# Patient Record
Sex: Female | Born: 1960 | Race: White | Hispanic: No | Marital: Married | Smoking: Current every day smoker
Health system: Southern US, Community
[De-identification: ages and names within clinical notes are randomized; demographics above are authoritative.]

## PROBLEM LIST (undated history)

## (undated) DIAGNOSIS — Z923 Personal history of irradiation: Secondary | ICD-10-CM

## (undated) DIAGNOSIS — C50919 Malignant neoplasm of unspecified site of unspecified female breast: Secondary | ICD-10-CM

## (undated) DIAGNOSIS — B019 Varicella without complication: Secondary | ICD-10-CM

## (undated) DIAGNOSIS — C539 Malignant neoplasm of cervix uteri, unspecified: Secondary | ICD-10-CM

## (undated) DIAGNOSIS — Z972 Presence of dental prosthetic device (complete) (partial): Secondary | ICD-10-CM

## (undated) HISTORY — DX: Malignant neoplasm of cervix uteri, unspecified: C53.9

## (undated) HISTORY — PX: COLONOSCOPY: SHX174

## (undated) HISTORY — DX: Personal history of irradiation: Z92.3

## (undated) HISTORY — DX: Varicella without complication: B01.9

---

## 1984-09-28 HISTORY — PX: CERVICAL CONE BIOPSY: SUR198

## 2011-02-08 ENCOUNTER — Emergency Department (HOSPITAL_BASED_OUTPATIENT_CLINIC_OR_DEPARTMENT_OTHER)
Admission: EM | Admit: 2011-02-08 | Discharge: 2011-02-08 | Disposition: A | Discharge status: Home / Self Care | Payer: BC Managed Care – PPO | Attending: Emergency Medicine | Admitting: Emergency Medicine

## 2011-02-08 DIAGNOSIS — F17200 Nicotine dependence, unspecified, uncomplicated: Secondary | ICD-10-CM | POA: Insufficient documentation

## 2011-02-08 DIAGNOSIS — K047 Periapical abscess without sinus: Secondary | ICD-10-CM | POA: Insufficient documentation

## 2012-05-31 ENCOUNTER — Ambulatory Visit: Payer: BC Managed Care – PPO | Admitting: Family Medicine

## 2012-06-27 ENCOUNTER — Telehealth: Payer: Self-pay | Admitting: Family Medicine

## 2012-06-27 ENCOUNTER — Ambulatory Visit (INDEPENDENT_AMBULATORY_CARE_PROVIDER_SITE_OTHER): Payer: BC Managed Care – PPO | Admitting: Family Medicine

## 2012-06-27 ENCOUNTER — Other Ambulatory Visit (HOSPITAL_COMMUNITY)
Admission: RE | Admit: 2012-06-27 | Discharge: 2012-06-27 | Disposition: A | Discharge status: Home / Self Care | Payer: BC Managed Care – PPO | Source: Ambulatory Visit | Attending: Family Medicine | Admitting: Family Medicine

## 2012-06-27 ENCOUNTER — Encounter: Payer: Self-pay | Admitting: Family Medicine

## 2012-06-27 VITALS — BP 127/82 | HR 90 | Temp 97.9°F | Ht 66.0 in | Wt 157.8 lb

## 2012-06-27 DIAGNOSIS — Z01419 Encounter for gynecological examination (general) (routine) without abnormal findings: Secondary | ICD-10-CM

## 2012-06-27 DIAGNOSIS — Z124 Encounter for screening for malignant neoplasm of cervix: Secondary | ICD-10-CM

## 2012-06-27 DIAGNOSIS — F17200 Nicotine dependence, unspecified, uncomplicated: Secondary | ICD-10-CM

## 2012-06-27 DIAGNOSIS — Z0000 Encounter for general adult medical examination without abnormal findings: Secondary | ICD-10-CM | POA: Insufficient documentation

## 2012-06-27 DIAGNOSIS — Z720 Tobacco use: Secondary | ICD-10-CM | POA: Insufficient documentation

## 2012-06-27 LAB — CBC WITH DIFFERENTIAL/PLATELET
Basophils Relative: 0.3 % (ref 0.0–3.0)
Eosinophils Absolute: 0.3 10*3/uL (ref 0.0–0.7)
Eosinophils Relative: 2.9 % (ref 0.0–5.0)
HCT: 45.7 % (ref 36.0–46.0)
Lymphs Abs: 2.1 10*3/uL (ref 0.7–4.0)
MCHC: 33.2 g/dL (ref 30.0–36.0)
MCV: 94.5 fl (ref 78.0–100.0)
Monocytes Absolute: 0.6 10*3/uL (ref 0.1–1.0)
Neutrophils Relative %: 68.4 % (ref 43.0–77.0)
RBC: 4.83 Mil/uL (ref 3.87–5.11)
WBC: 9.2 10*3/uL (ref 4.5–10.5)

## 2012-06-27 LAB — BASIC METABOLIC PANEL
BUN: 13 mg/dL (ref 6–23)
Calcium: 9 mg/dL (ref 8.4–10.5)
Creatinine, Ser: 0.6 mg/dL (ref 0.4–1.2)
GFR: 121.71 mL/min (ref >60.00)
Glucose, Bld: 94 mg/dL (ref 70–99)

## 2012-06-27 LAB — LIPID PANEL
Cholesterol: 186 mg/dL (ref 0–200)
HDL: 51.8 mg/dL (ref >39.00)
Triglycerides: 126 mg/dL (ref 0.0–149.0)
VLDL: 25.2 mg/dL (ref 0.0–40.0)

## 2012-06-27 LAB — HEPATIC FUNCTION PANEL
Albumin: 4.5 g/dL (ref 3.5–5.2)
Total Bilirubin: 0.5 mg/dL (ref 0.3–1.2)

## 2012-06-27 LAB — TSH: TSH: 1.17 u[IU]/mL (ref 0.35–5.50)

## 2012-06-27 NOTE — Patient Instructions (Addendum)
Follow up in 1 year or as needed Complete the stool card and mail it back We'll notify you of your lab results and make any changes if needed Call with any questions or concerns Welcome!  We're glad to have you!!!

## 2012-06-27 NOTE — Progress Notes (Signed)
  Subjective:    Patient ID: Olivia Gross, female    DOB: 30-Dec-1960, 51 y.o.   MRN: [id number]  HPI New to establish.  No local PCP, moved to area 3.5 yrs ago.  No concerns today.  Not interested in colonoscopy.  Overdue on mammo (10 yrs).   Review of Systems Patient reports no vision/ hearing changes, adenopathy,fever, weight change,  persistant/recurrent hoarseness , swallowing issues, chest pain, palpitations, edema, persistant/recurrent cough, hemoptysis, dyspnea (rest/exertional/paroxysmal nocturnal), gastrointestinal bleeding (melena, rectal bleeding), abdominal pain, significant heartburn, bowel changes, GU symptoms (dysuria, hematuria, incontinence), Gyn symptoms (abnormal  bleeding, pain),  syncope, focal weakness, memory loss, numbness & tingling, skin/hair/nail changes, abnormal bruising or bleeding, anxiety, or depression.     Objective:   Physical Exam   General Appearance:    Alert, cooperative, no distress, appears stated age  Head:    Normocephalic, without obvious abnormality, atraumatic  Eyes:    PERRL, conjunctiva/corneas clear, EOM's intact, fundi    benign, both eyes  Ears:    Normal TM's and external ear canals, both ears  Nose:   Nares normal, septum midline, mucosa normal, no drainage    or sinus tenderness  Throat:   Lips, mucosa, and tongue normal; teeth and gums normal  Neck:   Supple, symmetrical, trachea midline, no adenopathy;    Thyroid: no enlargement/tenderness/nodules  Back:     Symmetric, no curvature, ROM normal, no CVA tenderness  Lungs:     Clear to auscultation bilaterally, respirations unlabored  Chest Wall:    No tenderness or deformity   Heart:    Regular rate and rhythm, S1 and S2 normal, no murmur, rub   or gallop  Breast Exam:    No tenderness, masses, or nipple abnormality  Abdomen:     Soft, non-tender, bowel sounds active all four quadrants,    no masses, no organomegaly  Genitalia:    External genitalia normal, cervix normal in appearance,  no CMT, uterus in normal size and position, adnexa w/out mass or tenderness, mucosa pink and moist, no lesions or discharge present  Rectal:    Normal external appearance  Extremities:   Extremities normal, atraumatic, no cyanosis or edema  Pulses:   2+ and symmetric all extremities  Skin:   Skin color, texture, turgor normal, no rashes or lesions  Lymph nodes:   Cervical, supraclavicular, and axillary nodes normal  Neurologic:   CNII-XII intact, normal strength, sensation and reflexes    throughout          Assessment & Plan:

## 2012-06-27 NOTE — Assessment & Plan Note (Signed)
Pt's PE WNL.  Refer for mammo.  Refusing colonoscopy- willing to complete iFOB.  Pap collected.  Check labs.  Anticipatory guidance provided.

## 2012-06-27 NOTE — Telephone Encounter (Signed)
LM ON TRIAGE LINE 233PM -  what does she need to do with IFOP & Mammogram referral  CB# [phone]

## 2012-06-27 NOTE — Assessment & Plan Note (Signed)
New to provider, chronic for pt.  Encouraged smoking cessation.  Pt not interested.

## 2012-06-27 NOTE — Assessment & Plan Note (Signed)
Pap collected. 

## 2012-06-27 NOTE — Telephone Encounter (Signed)
Advised pt to follow directions given with IFOB today and either mail it in or drop it off at our office, also that someone from this office will contact her about the mammo apt, pt understood all instructions

## 2012-06-28 ENCOUNTER — Encounter: Payer: Self-pay | Admitting: *Deleted

## 2012-06-28 ENCOUNTER — Other Ambulatory Visit: Payer: BC Managed Care – PPO

## 2012-06-28 DIAGNOSIS — Z01419 Encounter for gynecological examination (general) (routine) without abnormal findings: Secondary | ICD-10-CM

## 2012-06-28 LAB — FECAL OCCULT BLOOD, IMMUNOCHEMICAL: Fecal Occult Bld: NEGATIVE

## 2012-06-29 ENCOUNTER — Encounter: Payer: Self-pay | Admitting: *Deleted

## 2012-06-30 ENCOUNTER — Encounter: Payer: Self-pay | Admitting: *Deleted

## 2012-07-01 LAB — VITAMIN D 1,25 DIHYDROXY: Vitamin D 1, 25 (OH)2 Total: 58 pg/mL (ref 18–72)

## 2012-07-06 ENCOUNTER — Ambulatory Visit (HOSPITAL_BASED_OUTPATIENT_CLINIC_OR_DEPARTMENT_OTHER)
Admission: RE | Admit: 2012-07-06 | Discharge: 2012-07-06 | Disposition: A | Discharge status: Home / Self Care | Payer: BC Managed Care – PPO | Source: Ambulatory Visit | Attending: Family Medicine | Admitting: Family Medicine

## 2012-07-06 ENCOUNTER — Ambulatory Visit: Payer: BC Managed Care – PPO

## 2012-07-06 DIAGNOSIS — Z1231 Encounter for screening mammogram for malignant neoplasm of breast: Secondary | ICD-10-CM | POA: Insufficient documentation

## 2012-07-06 DIAGNOSIS — R922 Inconclusive mammogram: Secondary | ICD-10-CM | POA: Insufficient documentation

## 2012-07-06 DIAGNOSIS — Z01419 Encounter for gynecological examination (general) (routine) without abnormal findings: Secondary | ICD-10-CM

## 2012-07-08 ENCOUNTER — Other Ambulatory Visit: Payer: Self-pay | Admitting: Family Medicine

## 2012-07-08 DIAGNOSIS — R928 Other abnormal and inconclusive findings on diagnostic imaging of breast: Secondary | ICD-10-CM

## 2012-07-14 ENCOUNTER — Ambulatory Visit
Admission: RE | Admit: 2012-07-14 | Discharge: 2012-07-14 | Disposition: A | Discharge status: Home / Self Care | Payer: BC Managed Care – PPO | Source: Ambulatory Visit | Attending: Family Medicine | Admitting: Family Medicine

## 2012-07-14 ENCOUNTER — Other Ambulatory Visit: Payer: Self-pay | Admitting: Family Medicine

## 2012-07-14 DIAGNOSIS — R928 Other abnormal and inconclusive findings on diagnostic imaging of breast: Secondary | ICD-10-CM

## 2012-07-14 DIAGNOSIS — N6310 Unspecified lump in the right breast, unspecified quadrant: Secondary | ICD-10-CM

## 2012-07-27 ENCOUNTER — Ambulatory Visit
Admission: RE | Admit: 2012-07-27 | Discharge: 2012-07-27 | Disposition: A | Discharge status: Home / Self Care | Payer: BC Managed Care – PPO | Source: Ambulatory Visit | Attending: Family Medicine | Admitting: Family Medicine

## 2012-07-27 DIAGNOSIS — N6310 Unspecified lump in the right breast, unspecified quadrant: Secondary | ICD-10-CM

## 2013-08-03 ENCOUNTER — Telehealth: Payer: Self-pay

## 2013-08-03 NOTE — Telephone Encounter (Addendum)
Left message for call back  identifiable Medication and allergies: reviewed and updated  90 day supply/mail order: na Local pharmacy: [facility]   Immunizations due:  Declines flu vaccine  A/P:   No changes to FH or PSH CCS--due--FOB-neg 06/2012 Pap-05/2012 MMG--06/2012--abnormal--US core biopsy--reccommendations q year  To Discuss with Provider: Requesting pap only No CPX on file

## 2013-08-04 ENCOUNTER — Ambulatory Visit (INDEPENDENT_AMBULATORY_CARE_PROVIDER_SITE_OTHER): Payer: BC Managed Care – PPO | Admitting: Family Medicine

## 2013-08-04 ENCOUNTER — Encounter: Payer: Self-pay | Admitting: Family Medicine

## 2013-08-04 VITALS — BP 130/88 | HR 80 | Temp 98.3°F | Resp 17 | Ht 66.5 in | Wt 162.2 lb

## 2013-08-04 DIAGNOSIS — Z23 Encounter for immunization: Secondary | ICD-10-CM

## 2013-08-04 DIAGNOSIS — Z0000 Encounter for general adult medical examination without abnormal findings: Secondary | ICD-10-CM

## 2013-08-04 DIAGNOSIS — Z1331 Encounter for screening for depression: Secondary | ICD-10-CM

## 2013-08-04 LAB — CBC WITH DIFFERENTIAL/PLATELET
Basophils Absolute: 0 10*3/uL (ref 0.0–0.1)
Basophils Relative: 0 % (ref 0–1)
Eosinophils Absolute: 0.2 10*3/uL (ref 0.0–0.7)
MCHC: 34.9 g/dL (ref 30.0–36.0)
Neutro Abs: 3.6 10*3/uL (ref 1.7–7.7)
Neutrophils Relative %: 51 % (ref 43–77)
Platelets: 231 10*3/uL (ref 150–400)
RDW: 13.3 % (ref 11.5–15.5)

## 2013-08-04 LAB — HEPATIC FUNCTION PANEL
Albumin: 4.4 g/dL (ref 3.5–5.2)
Bilirubin, Direct: 0.2 mg/dL (ref 0.0–0.3)
Total Bilirubin: 0.7 mg/dL (ref 0.3–1.2)

## 2013-08-04 LAB — BASIC METABOLIC PANEL
BUN: 11 mg/dL (ref 6–23)
CO2: 28 mEq/L (ref 19–32)
Calcium: 9.3 mg/dL (ref 8.4–10.5)
Creat: 0.71 mg/dL (ref 0.50–1.10)
Glucose, Bld: 76 mg/dL (ref 70–99)

## 2013-08-04 LAB — LIPID PANEL
Cholesterol: 167 mg/dL (ref 0–200)
HDL: 49 mg/dL (ref >39)
Total CHOL/HDL Ratio: 3.4 Ratio

## 2013-08-04 LAB — TSH: TSH: 1.087 u[IU]/mL (ref 0.350–4.500)

## 2013-08-04 NOTE — Addendum Note (Signed)
Addended by: Jackson Latino on: 08/04/2013 05:02 PM   Modules accepted: Orders

## 2013-08-04 NOTE — Addendum Note (Signed)
Addended by: Silvio Pate D on: 08/04/2013 04:28 PM   Modules accepted: Orders

## 2013-08-04 NOTE — Assessment & Plan Note (Signed)
Pt's PE WNL.  UTD on pap, plans to schedule mammo, due for colonoscopy- referral made.  Check labs.  Anticipatory guidance provided.

## 2013-08-04 NOTE — Patient Instructions (Signed)
Schedule your complete physical in 1 year We'll notify you of your lab results and make any changes if needed Schedule your mammogram Try and quit smoking! Call with any questions or concerns Happy Holidays!!

## 2013-08-04 NOTE — Progress Notes (Signed)
  Subjective:    Patient ID: Olivia Gross, female    DOB: November 03, 1960, 52 y.o.   MRN: [id number]  HPI CPE- UTD on pap.  Due for mammo and colonoscopy.   Review of Systems Patient reports no vision/ hearing changes, adenopathy,fever, weight change,  persistant/recurrent hoarseness , swallowing issues, chest pain, palpitations, edema, persistant/recurrent cough, hemoptysis, dyspnea (rest/exertional/paroxysmal nocturnal), gastrointestinal bleeding (melena, rectal bleeding), abdominal pain, significant heartburn, bowel changes, GU symptoms (dysuria, hematuria, incontinence), Gyn symptoms (abnormal  bleeding, pain),  syncope, focal weakness, memory loss, numbness & tingling, skin/hair/nail changes, abnormal bruising or bleeding, anxiety, or depression.     Objective:   Physical Exam  General Appearance:    Alert, cooperative, no distress, appears stated age  Head:    Normocephalic, without obvious abnormality, atraumatic  Eyes:    PERRL, conjunctiva/corneas clear, EOM's intact, fundi    benign, both eyes  Ears:    Normal TM's and external ear canals, both ears  Nose:   Nares normal, septum midline, mucosa normal, no drainage    or sinus tenderness  Throat:   Lips, mucosa, and tongue normal; teeth and gums normal  Neck:   Supple, symmetrical, trachea midline, no adenopathy;    Thyroid: no enlargement/tenderness/nodules  Back:     Symmetric, no curvature, ROM normal, no CVA tenderness  Lungs:     Clear to auscultation bilaterally, respirations unlabored  Chest Wall:    No tenderness or deformity   Heart:    Regular rate and rhythm, S1 and S2 normal, no murmur, rub   or gallop  Breast Exam:    No tenderness, masses, or nipple abnormality  Abdomen:     Soft, non-tender, bowel sounds active all four quadrants,    no masses, no organomegaly  Genitalia:    deferred  Rectal:    Extremities:   Extremities normal, atraumatic, no cyanosis or edema  Pulses:   2+ and symmetric all extremities  Skin:    Skin color, texture, turgor normal, no rashes or lesions  Lymph nodes:   Cervical, supraclavicular, and axillary nodes normal  Neurologic:   CNII-XII intact, normal strength, sensation and reflexes    throughout          Assessment & Plan:

## 2013-08-07 ENCOUNTER — Other Ambulatory Visit: Payer: Self-pay

## 2013-08-07 ENCOUNTER — Encounter: Payer: Self-pay | Admitting: General Practice

## 2013-08-07 DIAGNOSIS — Z1231 Encounter for screening mammogram for malignant neoplasm of breast: Secondary | ICD-10-CM

## 2013-08-11 LAB — VITAMIN D 1,25 DIHYDROXY: Vitamin D2 1, 25 (OH)2: <8 pg/mL

## 2013-08-16 ENCOUNTER — Telehealth: Payer: Self-pay | Admitting: Family Medicine

## 2013-08-16 DIAGNOSIS — Z1211 Encounter for screening for malignant neoplasm of colon: Secondary | ICD-10-CM

## 2013-08-16 NOTE — Telephone Encounter (Signed)
Referral placed.

## 2013-08-16 NOTE — Telephone Encounter (Signed)
Please refer to GI for colon cancer screen- thanks!

## 2013-08-16 NOTE — Telephone Encounter (Signed)
Patient am in on 08/04/13 and states that she was told she would be called to schedule a colonoscopy. She has not heard back from anyone. I did not see a referral for this in her chart. Please advise.

## 2013-08-23 ENCOUNTER — Encounter: Payer: BC Managed Care – PPO | Admitting: Family Medicine

## 2013-09-06 ENCOUNTER — Ambulatory Visit
Admission: RE | Admit: 2013-09-06 | Discharge: 2013-09-06 | Disposition: A | Discharge status: Home / Self Care | Payer: BC Managed Care – PPO | Source: Ambulatory Visit

## 2013-09-06 DIAGNOSIS — Z1231 Encounter for screening mammogram for malignant neoplasm of breast: Secondary | ICD-10-CM

## 2013-09-14 ENCOUNTER — Other Ambulatory Visit: Payer: Self-pay | Admitting: Family Medicine

## 2013-09-14 DIAGNOSIS — R928 Other abnormal and inconclusive findings on diagnostic imaging of breast: Secondary | ICD-10-CM

## 2013-10-12 ENCOUNTER — Ambulatory Visit
Admission: RE | Admit: 2013-10-12 | Discharge: 2013-10-12 | Disposition: A | Discharge status: Home / Self Care | Payer: BC Managed Care – PPO | Source: Ambulatory Visit | Attending: Family Medicine | Admitting: Family Medicine

## 2013-10-12 ENCOUNTER — Other Ambulatory Visit: Payer: Self-pay | Admitting: Family Medicine

## 2013-10-12 DIAGNOSIS — R928 Other abnormal and inconclusive findings on diagnostic imaging of breast: Secondary | ICD-10-CM

## 2013-10-25 ENCOUNTER — Ambulatory Visit
Admission: RE | Admit: 2013-10-25 | Discharge: 2013-10-25 | Disposition: A | Discharge status: Home / Self Care | Payer: BC Managed Care – PPO | Source: Ambulatory Visit | Attending: Family Medicine | Admitting: Family Medicine

## 2013-10-25 ENCOUNTER — Other Ambulatory Visit: Payer: Self-pay | Admitting: Family Medicine

## 2013-10-25 DIAGNOSIS — C50919 Malignant neoplasm of unspecified site of unspecified female breast: Secondary | ICD-10-CM

## 2013-10-25 DIAGNOSIS — R928 Other abnormal and inconclusive findings on diagnostic imaging of breast: Secondary | ICD-10-CM

## 2013-10-25 HISTORY — DX: Malignant neoplasm of unspecified site of unspecified female breast: C50.919

## 2013-10-26 ENCOUNTER — Other Ambulatory Visit: Payer: Self-pay | Admitting: Family Medicine

## 2013-10-26 DIAGNOSIS — C50919 Malignant neoplasm of unspecified site of unspecified female breast: Secondary | ICD-10-CM

## 2013-10-27 ENCOUNTER — Telehealth: Payer: Self-pay | Admitting: *Deleted

## 2013-10-27 DIAGNOSIS — C50212 Malignant neoplasm of upper-inner quadrant of left female breast: Secondary | ICD-10-CM | POA: Insufficient documentation

## 2013-10-27 NOTE — Telephone Encounter (Signed)
Confirmed [facility] for 11/01/13 at 12N .  Instructions and contact information given.

## 2013-10-30 ENCOUNTER — Ambulatory Visit
Admission: RE | Admit: 2013-10-30 | Discharge: 2013-10-30 | Disposition: A | Discharge status: Home / Self Care | Payer: BC Managed Care – PPO | Source: Ambulatory Visit | Attending: Family Medicine | Admitting: Family Medicine

## 2013-10-30 DIAGNOSIS — C50919 Malignant neoplasm of unspecified site of unspecified female breast: Secondary | ICD-10-CM

## 2013-10-30 MED ORDER — GADOBENATE DIMEGLUMINE 529 MG/ML IV SOLN
15.0000 mL | Freq: Once | INTRAVENOUS | Status: AC | PRN
  Administered 2013-10-30: 15 mL via INTRAVENOUS

## 2013-11-01 ENCOUNTER — Telehealth: Payer: Self-pay | Admitting: *Deleted

## 2013-11-01 ENCOUNTER — Other Ambulatory Visit (HOSPITAL_BASED_OUTPATIENT_CLINIC_OR_DEPARTMENT_OTHER): Payer: BC Managed Care – PPO

## 2013-11-01 ENCOUNTER — Ambulatory Visit: Payer: BC Managed Care – PPO | Attending: General Surgery | Admitting: Physical Therapy

## 2013-11-01 ENCOUNTER — Other Ambulatory Visit: Payer: Self-pay | Admitting: Oncology

## 2013-11-01 ENCOUNTER — Encounter: Payer: Self-pay | Admitting: Oncology

## 2013-11-01 ENCOUNTER — Ambulatory Visit
Admission: RE | Admit: 2013-11-01 | Discharge: 2013-11-01 | Disposition: A | Discharge status: Home / Self Care | Payer: BC Managed Care – PPO | Source: Ambulatory Visit | Attending: Radiation Oncology | Admitting: Radiation Oncology

## 2013-11-01 ENCOUNTER — Encounter (INDEPENDENT_AMBULATORY_CARE_PROVIDER_SITE_OTHER): Payer: Self-pay | Admitting: General Surgery

## 2013-11-01 ENCOUNTER — Ambulatory Visit (HOSPITAL_BASED_OUTPATIENT_CLINIC_OR_DEPARTMENT_OTHER): Payer: BC Managed Care – PPO | Admitting: General Surgery

## 2013-11-01 ENCOUNTER — Ambulatory Visit: Payer: BC Managed Care – PPO

## 2013-11-01 ENCOUNTER — Ambulatory Visit (HOSPITAL_BASED_OUTPATIENT_CLINIC_OR_DEPARTMENT_OTHER): Payer: BC Managed Care – PPO | Admitting: Oncology

## 2013-11-01 VITALS — BP 151/91 | HR 88 | Temp 97.7°F | Resp 18 | Ht 66.0 in | Wt 160.3 lb

## 2013-11-01 DIAGNOSIS — Z170 Estrogen receptor positive status [ER+]: Secondary | ICD-10-CM

## 2013-11-01 DIAGNOSIS — C50212 Malignant neoplasm of upper-inner quadrant of left female breast: Secondary | ICD-10-CM

## 2013-11-01 DIAGNOSIS — F17200 Nicotine dependence, unspecified, uncomplicated: Secondary | ICD-10-CM | POA: Insufficient documentation

## 2013-11-01 DIAGNOSIS — Z720 Tobacco use: Secondary | ICD-10-CM

## 2013-11-01 DIAGNOSIS — C50919 Malignant neoplasm of unspecified site of unspecified female breast: Secondary | ICD-10-CM | POA: Insufficient documentation

## 2013-11-01 DIAGNOSIS — C50219 Malignant neoplasm of upper-inner quadrant of unspecified female breast: Secondary | ICD-10-CM

## 2013-11-01 DIAGNOSIS — IMO0001 Reserved for inherently not codable concepts without codable children: Secondary | ICD-10-CM | POA: Insufficient documentation

## 2013-11-01 DIAGNOSIS — R293 Abnormal posture: Secondary | ICD-10-CM | POA: Insufficient documentation

## 2013-11-01 LAB — CBC WITH DIFFERENTIAL/PLATELET
BASO%: 0.6 % (ref 0.0–2.0)
Basophils Absolute: 0 10*3/uL (ref 0.0–0.1)
EOS ABS: 0.1 10*3/uL (ref 0.0–0.5)
EOS%: 1.5 % (ref 0.0–7.0)
HCT: 41.5 % (ref 34.8–46.6)
HGB: 13.9 g/dL (ref 11.6–15.9)
LYMPH%: 33.6 % (ref 14.0–49.7)
MCH: 31.2 pg (ref 25.1–34.0)
MCHC: 33.7 g/dL (ref 31.5–36.0)
MCV: 92.9 fL (ref 79.5–101.0)
MONO#: 0.4 10*3/uL (ref 0.1–0.9)
MONO%: 5.4 % (ref 0.0–14.0)
NEUT%: 58.9 % (ref 38.4–76.8)
NEUTROS ABS: 4.4 10*3/uL (ref 1.5–6.5)
Platelets: 240 10*3/uL (ref 145–400)
RBC: 4.46 10*6/uL (ref 3.70–5.45)
RDW: 13.1 % (ref 11.2–14.5)
WBC: 7.4 10*3/uL (ref 3.9–10.3)
lymph#: 2.5 10*3/uL (ref 0.9–3.3)

## 2013-11-01 LAB — COMPREHENSIVE METABOLIC PANEL (CC13)
ALBUMIN: 4.7 g/dL (ref 3.5–5.0)
ALK PHOS: 50 U/L (ref 40–150)
ALT: 15 U/L (ref 0–55)
AST: 14 U/L (ref 5–34)
Anion Gap: 10 mEq/L (ref 3–11)
BILIRUBIN TOTAL: 0.84 mg/dL (ref 0.20–1.20)
BUN: 12 mg/dL (ref 7.0–26.0)
CO2: 26 mEq/L (ref 22–29)
Calcium: 9.9 mg/dL (ref 8.4–10.4)
Chloride: 105 mEq/L (ref 98–109)
Creatinine: 0.7 mg/dL (ref 0.6–1.1)
Glucose: 91 mg/dl (ref 70–140)
POTASSIUM: 3.8 meq/L (ref 3.5–5.1)
Sodium: 141 mEq/L (ref 136–145)
TOTAL PROTEIN: 7.2 g/dL (ref 6.4–8.3)

## 2013-11-01 NOTE — Patient Instructions (Signed)
Smoking Cessation Quitting smoking is important to your health and has many advantages. However, it is not always easy to quit since nicotine is a very addictive drug. Often times, people try 3 times or more before being able to quit. This document explains the best ways for you to prepare to quit smoking. Quitting takes hard work and a lot of effort, but you can do it. ADVANTAGES OF QUITTING SMOKING  You will live longer, feel better, and live better.  Your body will feel the impact of quitting smoking almost immediately.  Within 20 minutes, blood pressure decreases. Your pulse returns to its normal level.  After 8 hours, carbon monoxide levels in the blood return to normal. Your oxygen level increases.  After 24 hours, the chance of having a heart attack starts to decrease. Your breath, hair, and body stop smelling like smoke.  After 48 hours, damaged nerve endings begin to recover. Your sense of taste and smell improve.  After 72 hours, the body is virtually free of nicotine. Your bronchial tubes relax and breathing becomes easier.  After 2 to 12 weeks, lungs can hold more air. Exercise becomes easier and circulation improves.  The risk of having a heart attack, stroke, cancer, or lung disease is greatly reduced.  After 1 year, the risk of coronary heart disease is cut in half.  After 5 years, the risk of stroke falls to the same as a nonsmoker.  After 10 years, the risk of lung cancer is cut in half and the risk of other cancers decreases significantly.  After 15 years, the risk of coronary heart disease drops, usually to the level of a nonsmoker.  If you are pregnant, quitting smoking will improve your chances of having a healthy baby.  The people you live with, especially any children, will be healthier.  You will have extra money to spend on things other than cigarettes. QUESTIONS TO THINK ABOUT BEFORE ATTEMPTING TO QUIT You may want to talk about your answers with your  caregiver.  Why do you want to quit?  If you tried to quit in the past, what helped and what did not?  What will be the most difficult situations for you after you quit? How will you plan to handle them?  Who can help you through the tough times? Your family? Friends? A caregiver?  What pleasures do you get from smoking? What ways can you still get pleasure if you quit? Here are some questions to ask your caregiver:  How can you help me to be successful at quitting?  What medicine do you think would be best for me and how should I take it?  What should I do if I need more help?  What is smoking withdrawal like? How can I get information on withdrawal? GET READY  Set a quit date.  Change your environment by getting rid of all cigarettes, ashtrays, matches, and lighters in your home, car, or work. Do not let people smoke in your home.  Review your past attempts to quit. Think about what worked and what did not. GET SUPPORT AND ENCOURAGEMENT You have a better chance of being successful if you have help. You can get support in many ways.  Tell your family, friends, and co-workers that you are going to quit and need their support. Ask them not to smoke around you.  Get individual, group, or telephone counseling and support. Programs are available at local hospitals and health centers. Call your local health department for   information about programs in your area.  Spiritual beliefs and practices may help some smokers quit.  Download a "quit meter" on your computer to keep track of quit statistics, such as how long you have gone without smoking, cigarettes not smoked, and money saved.  Get a self-help book about quitting smoking and staying off of tobacco. LEARN NEW SKILLS AND BEHAVIORS  Distract yourself from urges to smoke. Talk to someone, go for a walk, or occupy your time with a task.  Change your normal routine. Take a different route to work. Drink tea instead of coffee.  Eat breakfast in a different place.  Reduce your stress. Take a hot bath, exercise, or read a book.  Plan something enjoyable to do every day. Reward yourself for not smoking.  Explore interactive web-based programs that specialize in helping you quit. GET MEDICINE AND USE IT CORRECTLY Medicines can help you stop smoking and decrease the urge to smoke. Combining medicine with the above behavioral methods and support can greatly increase your chances of successfully quitting smoking.  Nicotine replacement therapy helps deliver nicotine to your body without the negative effects and risks of smoking. Nicotine replacement therapy includes nicotine gum, lozenges, inhalers, nasal sprays, and skin patches. Some may be available over-the-counter and others require a prescription.  Antidepressant medicine helps people abstain from smoking, but how this works is unknown. This medicine is available by prescription.  Nicotinic receptor partial agonist medicine simulates the effect of nicotine in your brain. This medicine is available by prescription. Ask your caregiver for advice about which medicines to use and how to use them based on your health history. Your caregiver will tell you what side effects to look out for if you choose to be on a medicine or therapy. Carefully read the information on the package. Do not use any other product containing nicotine while using a nicotine replacement product.  RELAPSE OR DIFFICULT SITUATIONS Most relapses occur within the first 3 months after quitting. Do not be discouraged if you start smoking again. Remember, most people try several times before finally quitting. You may have symptoms of withdrawal because your body is used to nicotine. You may crave cigarettes, be irritable, feel very hungry, cough often, get headaches, or have difficulty concentrating. The withdrawal symptoms are only temporary. They are strongest when you first quit, but they will go away within  10 14 days. To reduce the chances of relapse, try to:  Avoid drinking alcohol. Drinking lowers your chances of successfully quitting.  Reduce the amount of caffeine you consume. Once you quit smoking, the amount of caffeine in your body increases and can give you symptoms, such as a rapid heartbeat, sweating, and anxiety.  Avoid smokers because they can make you want to smoke.  Do not let weight gain distract you. Many smokers will gain weight when they quit, usually less than 10 pounds. Eat a healthy diet and stay active. You can always lose the weight gained after you quit.  Find ways to improve your mood other than smoking. FOR MORE INFORMATION  www.smokefree.gov  Document Released: 09/08/2001 Document Revised: 03/15/2012 Document Reviewed: 12/24/2011 [employer] Patient Information 2014 [employer].  

## 2013-11-01 NOTE — Progress Notes (Signed)
[facility] Psychosocial Distress Screening Clinical Social Work  Clinical Social Work Intern met Patient at breast clinic appointment to assess psychosocial needs and concerns.  She appears to have adequate knowledge of her treatment plan. The patient scored a 5 on the Psychosocial Distress Thermometer which indicates moderate distress. Clinical Network engineer reviewed with Patient, resources/programs at [facility] to assist.  CSWI provided Patient with written information on services and are aware to reach out to [facility] as needed.  Patient stated she has adequate support at this time.    Clinical Social Worker follow up needed: no  If yes, follow up plan:   Olivia Gross S. [place] Work Intern [employer] [phone]

## 2013-11-01 NOTE — Progress Notes (Signed)
Olivia Gross [id number] 27-Jul-1961 52 y.o. 11/01/2013 3:59 PM  CC Dr. Autumn Messing Dr. Arloa Koh  [name], MD 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 Patient is seen in the multidisciplinary breast clinic for discussion of treatment options.  STAGE:   Breast cancer of upper-inner quadrant of left female breast   Primary site: Breast (Left)   Staging method: AJCC 7th Edition   Clinical: Stage IA (T1c, N0, cM0)   Summary: Stage IA (T1c, N0, cM0)  REFERRING PHYSICIAN: Dr. Autumn Messing  HISTORY OF PRESENT ILLNESS:  Olivia Gross is a 53 y.o. female.  Who had a screening mammogram performed on 09/06/2013 that revealed a possible mass in the left breast. In 10/12/2013 she had spot compression views performed that was consistent with a true mass lying medially near the 9:00 position measuring 7-8 mm in size. Ultrasound showed a lobulated hypoechoic mass in the 10:00 position 10 cm from the nipple measuring 8 mm x 6 mm x 6.6 mm. Patient had a biopsy performed on 10/25/2013 the final pathology revealed grade 1 invasive ductal carcinoma that was ER positive PR positive with a proliferation marker Ki-67 30% and HER-2/neu receptor testing is pending. Patient had MRI of the breasts performed on 10/30/2013 in the right breast there were no masses or abnormal enhancement. In the left breast within the upper-outer quadrant there was an enhancing mass associated with clip artifact measuring 1.3 x 1.1 x 1.9 cm. No abnormal appearing lymph nodes were identified. Patient's case was discussed at the multidisciplinary breast conference. She is now seen in the multidisciplinary breast clinic for discussion of treatment options. She is without any complaints related to her breast cancer. She was also seen by Dr. Autumn Messing and Dr. Arloa Koh. She is  accompanied by her husband. Patient is a every day smoker and she and I did discuss smoking cessation and literature/material was given to her as well   Past Medical History: Past Medical History  Diagnosis Date  . Chicken pox   . Cervical cancer     Past Surgical History: History reviewed. No pertinent past surgical history.  Family History: Family History  Problem Relation Age of Onset  . Arthritis Mother   . Mental illness Maternal Aunt   . Heart disease Paternal Uncle   . Diabetes Maternal Grandmother   . Diabetes Maternal Grandfather     Social History History  Substance Use Topics  . Smoking status: Current Every Day Smoker -- 0.50 packs/day for 20 years    Types: Cigarettes  . Smokeless tobacco: Not on file  . Alcohol Use: Yes     Comment: occaisonally    Allergies: No Known Allergies  Current Medications: Current Outpatient Prescriptions  Medication Sig Dispense Refill  . Multiple Vitamin (MULTIVITAMIN) tablet Take 1 tablet by mouth daily.      . naproxen sodium (ANAPROX) 220 MG tablet Take 220 mg by mouth daily as needed. Take for sinuses      . norethindrone-ethinyl estradiol (OVCON-50) 1-50 MG-MCG tablet Take 1 tablet by mouth daily.       No current facility-administered medications for this visit.    OB/GYN History: Menarche at age 62 her last menstrual period was in 2012 she does have birth control depot (Implanon). First live birth was at age 55 she's had 2 term pregnancies.  Fertility Discussion:  Patient has completed her family Prior History of Cancer: Cervical cancer  Health Maintenance:  Colonoscopy January 2015 Bone Density no Last PAP smear December 2013  ECOG PERFORMANCE STATUS: 0 - Asymptomatic  Genetic Counseling/testing: n/a  REVIEW OF SYSTEMS:  A comprehensive review of systems was negative. except for night sweats  PHYSICAL EXAMINATION: Blood pressure 151/91, pulse 88, temperature 97.7 F (36.5 C), temperature source Oral,  resp. rate 18, height _0  (1.676 m), weight 160 lb 4.8 oz (72.712 kg).  General:  well-nourished in no acute distress.  Eyes:  no scleral icterus.  ENT:  There were no oropharyngeal lesions.  Neck was without thyromegaly.  Lymphatics:  Negative cervical, supraclavicular or axillary adenopathy.  Respiratory: lungs were clear bilaterally without wheezing or crackles.  Cardiovascular:  Regular rate and rhythm, S1/S2, without murmur, rub or gallop.  There was no pedal edema.  GI:  abdomen was soft, flat, nontender, nondistended, without organomegaly.  Muscoloskeletal:  no spinal tenderness of palpation of vertebral spine.  Skin exam was without echymosis, petichae.  Neuro exam was nonfocal.  Patient was able to get on and off exam table without assistance.  Gait was normal.  Patient was alerted and oriented.  Attention was good.   Language was appropriate.  Mood was normal without depression.  Speech was not pressured.  Thought content was not tangential.   Breasts: right breast normal without mass, skin or nipple changes or axillary nodes, abnormal mass palpable in the left breast from recent biopsy with area of ecchymosis no nipple discharge or other skin changes.   STUDIES/RESULTS: Mr Breast Bilateral W Wo Contrast  10/30/2013   CLINICAL DATA:  Ultrasound-guided core biopsy of screen detected mass in the 10 o'clock location of left breast shows invasive ductal carcinoma.  EXAM: BILATERAL BREAST MRI WITH AND WITHOUT CONTRAST  TECHNIQUE: Multiplanar, multisequence MR images of both breasts were obtained prior to and following the intravenous administration of 34m of MultiHance.  THREE-DIMENSIONAL MR IMAGE RENDERING ON INDEPENDENT WORKSTATION:  Three-dimensional MR images were rendered by post-processing of the original MR data on an independent workstation. The three-dimensional MR images were interpreted, and findings are reported in the following complete MRI report for this study. Three dimensional images  were evaluated at the independent DynaCad workstation  COMPARISON:  [facility] and earlier  FINDINGS: Breast composition: c:  Heterogeneous fibroglandular tissue  Background parenchymal enhancement: Moderate  Right breast: No mass or abnormal enhancement.  Left breast: Within the upper-outer quadrant, there is an enhancing mass associated with clip artifact. This measures 1.3 x 1.1 x 1.9 cm and demonstrates rapid wash-in and washout kinetics. Findings are consistent with recently diagnosed invasive ductal carcinoma. Elsewhere within the left breast, no suspicious abnormality is identified.  Lymph nodes: No abnormal appearing lymph nodes.  Ancillary findings:  None.  IMPRESSION: 1. Solitary mass within the upper-outer quadrant of the left breast, consistent with known malignancy. 2. Right breast is negative by MRI.  RECOMMENDATION: Treatment plan for known malignancy.  BI-RADS CATEGORY  6: Known biopsy-proven malignancy - appropriate action should be taken.   Electronically Signed   By: BShon HaleM.D.   On: 10/30/2013 14:03   Mm Digital Diagnostic Unilat L  10/25/2013   CLINICAL DATA:  Post left breast ultrasound-guided core biopsy.  EXAM: POST-BIOPSY CLIP PLACEMENT left breast DIAGNOSTIC MAMMOGRAM  COMPARISON:  Previous exams.  FINDINGS: Films are performed following ultrasound guided biopsy of the mass located  within the left breast 10 o'clock position. The ribbon shaped clip is in appropriate position.  IMPRESSION: Appropriate position of clip following left breast ultrasound-guided core biopsy.  Final Assessment: Post Procedure Mammograms for Marker Placement   Electronically Signed   By: Luberta Robertson M.D.   On: 10/25/2013 15:17   Mm Digital Diagnostic Unilat L  10/12/2013   CLINICAL DATA:  Possible mass in the left breast on the current screening study.  EXAM: DIGITAL DIAGNOSTIC  LEFT MAMMOGRAM  ULTRASOUND LEFT BREAST  COMPARISON:   07/07/2012  ACR Breast Density Category c: The breast tissue is heterogeneously dense, which may obscure small masses.  FINDINGS: Possible mass persists on spot compression imaging consistent with a true mass. That lies medially, nearly 9 o'clock position. It measures approximately 7-8 mm in size.  Ultrasound is performed, showing a lobulated, hypoechoic mass in the 10 o'clock position, 10 cm the nipple measuring 8 mm x 6 mm x 6.6 mm. There is minimal internal blood flow.  Sonographic imaging of the left axilla shows no masses or enlarged or abnormal lymph nodes.  IMPRESSION: Suspicious left breast mass for breast carcinoma.  RECOMMENDATION: Biopsy. Patient will be scheduled for ultrasound-guided core needle biopsy of this left breast mass.  I have discussed the findings and recommendations with the patient. Results were also provided in writing at the conclusion of the visit. If applicable, a reminder letter will be sent to the patient regarding the next appointment.  BI-RADS CATEGORY  5: Highly suggestive of malignancy - appropriate action should be taken.   Electronically Signed   By: Lajean Manes M.D.   On: 10/12/2013 16:00   US Breast Ltd Uni Left Inc Axilla  10/12/2013   CLINICAL DATA:  Possible mass in the left breast on the current screening study.  EXAM: DIGITAL DIAGNOSTIC  LEFT MAMMOGRAM  ULTRASOUND LEFT BREAST  COMPARISON:  07/07/2012  ACR Breast Density Category c: The breast tissue is heterogeneously dense, which may obscure small masses.  FINDINGS: Possible mass persists on spot compression imaging consistent with a true mass. That lies medially, nearly 9 o'clock position. It measures approximately 7-8 mm in size.  Ultrasound is performed, showing a lobulated, hypoechoic mass in the 10 o'clock position, 10 cm the nipple measuring 8 mm x 6 mm x 6.6 mm. There is minimal internal blood flow.  Sonographic imaging of the left axilla shows no masses or enlarged or abnormal lymph nodes.  IMPRESSION:  Suspicious left breast mass for breast carcinoma.  RECOMMENDATION: Biopsy. Patient will be scheduled for ultrasound-guided core needle biopsy of this left breast mass.  I have discussed the findings and recommendations with the patient. Results were also provided in writing at the conclusion of the visit. If applicable, a reminder letter will be sent to the patient regarding the next appointment.  BI-RADS CATEGORY  5: Highly suggestive of malignancy - appropriate action should be taken.   Electronically Signed   By: Lajean Manes M.D.   On: 10/12/2013 16:00   [place] Lt Breast Bx W Loc Dev 1st Lesion Img Bx Spec US Guide  10/26/2013   ADDENDUM REPORT: 10/26/2013 15:37  ADDENDUM: Final pathology demonstrates GRADE 1 INVASIVE DUCTAL CARCINOMA.  Histology correlates with imaging findings.  The patient was contacted by phone on 10/26/2013 and these results given to her which she understood. Her questions were answered.  The patient had no complaints with her biopsy site. .  Recommend surgery/oncology consultation. An appointment at the multi disciplinary [facility]  has been scheduled for 11/01/2013 and the patient informed.  Recommend breast MRI, which has been scheduled for 10/30/2013 and the patient informed.   Electronically Signed   By: Hassan Rowan M.D.   On: 10/26/2013 15:37   10/26/2013   CLINICAL DATA:  Left breast mass  EXAM: ULTRASOUND GUIDED LEFT BREAST CORE NEEDLE BIOPSY WITH VACUUM ASSIST  COMPARISON:  Previous exams.  PROCEDURE: I met with the patient and we discussed the procedure of ultrasound-guided biopsy, including benefits and alternatives. We discussed the high likelihood of a successful procedure. We discussed the risks of the procedure including infection, bleeding, tissue injury, clip migration, and inadequate sampling. Informed written consent was given. The usual time-out protocol was performed immediately prior to the procedure.  Using sterile technique and 2% Lidocaine as local  anesthetic, under direct ultrasound visualization, a 14 gauge core biopsydevice was used to perform biopsy of the mass located within the left breast 10 o'clock position using a lateral approach. At the conclusion of the procedure, a ribbon shaped tissue marker clip was deployed into the biopsy cavity. Follow-up 2-view mammogram was performed and dictated separately.  IMPRESSION: Ultrasound-guided biopsy of the left breast mass located at 10 o'clock position as discussed above. No apparent complications.  Electronically Signed: By: Luberta Robertson M.D. On: 10/25/2013 15:03     LABS:    Chemistry      Component Value Date/Time   NA 141 11/01/2013 1137   NA 138 08/04/2013 1628   K 3.8 11/01/2013 1137   K 3.7 08/04/2013 1628   CL 104 08/04/2013 1628   CO2 26 11/01/2013 1137   CO2 28 08/04/2013 1628   BUN 12.0 11/01/2013 1137   BUN 11 08/04/2013 1628   CREATININE 0.7 11/01/2013 1137   CREATININE 0.71 08/04/2013 1628   CREATININE 0.6 06/27/2012 1106      Component Value Date/Time   CALCIUM 9.9 11/01/2013 1137   CALCIUM 9.3 08/04/2013 1628   ALKPHOS 50 11/01/2013 1137   ALKPHOS 43 08/04/2013 1628   AST 14 11/01/2013 1137   AST 14 08/04/2013 1628   ALT 15 11/01/2013 1137   ALT 11 08/04/2013 1628   BILITOT 0.84 11/01/2013 1137   BILITOT 0.7 08/04/2013 1628      Lab Results  Component Value Date   WBC 7.4 11/01/2013   HGB 13.9 11/01/2013   HCT 41.5 11/01/2013   MCV 92.9 11/01/2013   PLT 240 11/01/2013   PATHOLOGY Diagnosis Breast, left, needle core biopsy, 10 o'clock - INVASIVE DUCTAL CARCINOMA. - SEE COMMENT. Microscopic Comment Although definitive grading of breast carcinoma is best done on excision, the features of the invasive tumor from the left 10 o'clock biopsy are compatible with a grade I breast carcinoma. Breast prognostic markers will be performed and reported in an addendum. Findings are called to [facility] on 10/26/13. Dr. Donato Heinz has seen this case in consultation with agreement.  (RAH:gt, 10/26/13) Willeen Niece MD Pathologist, Electronic  ASSESSMENT/PLAN    53 year old female with  #1 stage I (T1 Nx) invasive ductal carcinoma, grade 1, ER positive PR positive, HER-2 is pending with a proliferation marker Ki-67 30%. Patient did have MRI that revealed the tumor to be 1.9 cm.   #2 We spent the better part of today's hour-long appointment discussing the biology of breast cancer in general, and the specifics of the patient's tumor in particular.I went over with the patient the pathology and the radiology findings. We discussed treatment options including surgery with  lumpectomy versus a mastectomy with sentinel lymph node biopsy. We discussed the prognostic markers leading to recommendation for antiestrogen therapy since her tumor is ER positive PR positive and HER-2/neu negative with a low proliferation marker Ki-67. We also discussed Oncotype DX testing.  #3 we discussed adjuvant antiestrogen therapy consisting of tamoxifen. We discussed risks benefits and rationale for use of tamoxifen in the pre-menopausal setting. We discussed side effects of tamoxifen consisting of but not limited to hot flashes, night sweats, mood swings, induration or cataracts, irregular periods, endometrial cancer, blood clots/thrombosis, vaginal bleeding.  #4 we discussed smoking cessation and literature was given to her. I also offered to send her to the smoking cessation classes.  #5 we discussed removing the depot birth control implant.  #6 patient will be seen back after her surgery.    Discussion: Patient is being treated per NCCN breast cancer care guidelines appropriate for stage.I   Thank you so much for allowing me to participate in the care of Clayburn Pert. I will continue to follow up the patient with you and assist in her care.  All questions were answered. The patient knows to call the clinic with any problems, questions or concerns. We can certainly see the patient much sooner  if necessary.  I spent 40 minutes counseling the patient face to face. The total time spent in the appointment was 60 minutes.  Marcy Panning, MD Medical/Oncology [facility] [phone] (beeper) [phone] (Office)  11/01/2013, 3:59 PM

## 2013-11-01 NOTE — Progress Notes (Signed)
Patient ID: Olivia Gross, female   DOB: 03/21/1961, 52 y.o.   MRN: [id number]  No chief complaint on file.   HPI Olivia Gross is a 53 y.o. female.  We are asked to see the patient in consultation by Dr. Owens Shark to evaluate her for a left-sided breast cancer. The patient is a 53 year old white female who recently went for a routine screening mammogram. At that time she was found to have an abnormality in the upper inner left breast. She denies any breast pain. She denies any discharge from her nipple. This abnormality measured 8 mm by ultrasound and 1.3 cm by MRI. It was biopsied and came back as an invasive ductal cancer. She was ER and PR positive with a Ki-67 of 30% she is otherwise in good health. Of note she does have an Implanon device in her left upper arm For birth control. HPI  Past Medical History  Diagnosis Date  . Chicken pox   . Cervical cancer     History reviewed. No pertinent past surgical history.  Family History  Problem Relation Age of Onset  . Arthritis Mother   . Mental illness Maternal Aunt   . Heart disease Paternal Uncle   . Diabetes Maternal Grandmother   . Diabetes Maternal Grandfather     Social History History  Substance Use Topics  . Smoking status: Current Every Day Smoker -- 0.50 packs/day for 20 years    Types: Cigarettes  . Smokeless tobacco: Not on file  . Alcohol Use: Yes     Comment: occaisonally    No Known Allergies  Current Outpatient Prescriptions  Medication Sig Dispense Refill  . Multiple Vitamin (MULTIVITAMIN) tablet Take 1 tablet by mouth daily.      . naproxen sodium (ANAPROX) 220 MG tablet Take 220 mg by mouth daily as needed. Take for sinuses      . norethindrone-ethinyl estradiol (OVCON-50) 1-50 MG-MCG tablet Take 1 tablet by mouth daily.       No current facility-administered medications for this visit.    Review of Systems Review of Systems  Constitutional: Negative.   HENT: Negative.   Eyes: Negative.   Respiratory:  Negative.   Cardiovascular: Negative.   Gastrointestinal: Negative.   Endocrine: Negative.   Genitourinary: Negative.   Musculoskeletal: Negative.   Skin: Negative.   Allergic/Immunologic: Negative.   Neurological: Negative.   Hematological: Negative.   Psychiatric/Behavioral: Negative.     There were no vitals taken for this visit.  Physical Exam Physical Exam  Constitutional: She is oriented to person, place, and time. She appears well-developed and well-nourished.  HENT:  Head: Normocephalic and atraumatic.  Eyes: Conjunctivae and EOM are normal. Pupils are equal, round, and reactive to light.  Neck: Normal range of motion. Neck supple.  Cardiovascular: Normal rate, regular rhythm and normal heart sounds.   Pulmonary/Chest: Effort normal and breath sounds normal.  There is no palpable mass in either breast. There is no palpable axillary, supraclavicular, or cervical lymphadenopathy  Abdominal: Soft. Bowel sounds are normal.  Musculoskeletal: Normal range of motion.  In the ulnar aspect of the left upper arm she has a hormone delivery device implanted just under the skin that is palpable  Lymphadenopathy:    She has no cervical adenopathy.  Neurological: She is alert and oriented to person, place, and time.  Skin: Skin is warm and dry.  Psychiatric: She has a normal mood and affect. Her behavior is normal.    Data Reviewed As above  Assessment  The patient appears to have a small stage I invasive ductal cancer of the left breast. I've discussed with her in detail the different options for treatment. She favors breast conservation. I think this is a reasonable choice. I've discussed with her in detail the risks and benefits of the operation to remove the cancer as well as map out the lymph nodes including some of the technical aspects and she understands and wishes to proceed. she would also need to have the hormone delivery device removed from her left upper arm      Plan    Plan for a left breast wire localized lumpectomy and sentinel node mapping with removal of hormone delivery device from the left upper arm        TOTH III,PAUL S 11/01/2013, 2:59 PM

## 2013-11-01 NOTE — Progress Notes (Addendum)
[facility] NEW PATIENT EVALUATION  Name: Olivia Gross MRN: [id number]  Date:   11/01/2013           DOB: 06-05-1961  Status: outpatient   CC: [name], MD  Merrie Roof, MD    REFERRING PHYSICIAN: Merrie Roof, MD   DIAGNOSIS: Stage I (T1, N0, M0) invasive ductal carcinoma of the left breast   HISTORY OF PRESENT ILLNESS:  Olivia Gross is a 53 y.o. female who is seen today at the [facility] through the courtesy of Dr. Marlou Starks for evaluation of her T1 N0 invasive ductal carcinoma of the left breast. At the time of a screening mammogram at the [facility] on 09/06/2013 there was the question of a left breast mass. Additional views on 10/12/2013 showed a mass at 9:00 approximately 7-8 mm in size. Ultrasound showed a mass at 10:00 measuring 0.8 x 0.6 x 0.6 cm. The left axilla appear to be normal on ultrasound. Ultrasound-guided biopsy on 10/25/2013 was diagnostic for invasive ductal carcinoma. This was ER/PR positive at 100% with an elevated Ki-67 of 30%. HER-2/neu is pending. Breast MR on 10/30/2013 showed a solitary 1.3 x 1.1 x 1.9 cm mass within the upper breast consistent with known malignancy. Axilla was benign. She is without complaints today. She seen today with Dr. Humphrey Rolls and Dr. Marlou Starks. Of note is that she does have a hormone delivery device for birth control, and this will removed by Dr. Marlou Starks the time of her surgery.  PREVIOUS RADIATION THERAPY: No   PAST MEDICAL HISTORY:  has a past medical history of Chicken pox and Cervical cancer.     PAST SURGICAL HISTORY: No past surgical history on file.   FAMILY HISTORY: family history includes Arthritis in her mother; Diabetes in her maternal grandfather and maternal grandmother; Heart disease in her paternal uncle; Mental illness in her maternal aunt. Her father committed suicide in his 38s. Her mother is alive at 55 with COPD and a history of cervical cancer. No family history of breast  cancer.   SOCIAL HISTORY:  reports that she has been smoking Cigarettes.  She has a 10 pack-year smoking history. She does not have any smokeless tobacco history on file. She reports that she drinks alcohol. She reports that she does not use illicit drugs. Married, 2 children. She works at a [employer].   ALLERGIES: Review of patient's allergies indicates no known allergies.   MEDICATIONS:  Current Outpatient Prescriptions  Medication Sig Dispense Refill  . Multiple Vitamin (MULTIVITAMIN) tablet Take 1 tablet by mouth daily.      . naproxen sodium (ANAPROX) 220 MG tablet Take 220 mg by mouth daily as needed. Take for sinuses      . norethindrone-ethinyl estradiol (OVCON-50) 1-50 MG-MCG tablet Take 1 tablet by mouth daily.       No current facility-administered medications for this encounter.     REVIEW OF SYSTEMS:  Pertinent items are noted in HPI.    PHYSICAL EXAM: Alert and oriented 53 year old white female appearing her stated age. Wt Readings from Last 3 Encounters:  11/01/13 160 lb 4.8 oz (72.712 kg)  08/04/13 162 lb 4 oz (73.596 kg)  06/27/12 157 lb 12.8 oz (71.578 kg)   Temp Readings from Last 3 Encounters:  11/01/13 97.7 F (36.5 C) Oral  08/04/13 98.3 F (36.8 C) Oral  06/27/12 97.9 F (36.6 C) Oral   BP Readings from Last 3 Encounters:  11/01/13 151/91  08/04/13  130/88  06/27/12 127/82   Pulse Readings from Last 3 Encounters:  11/01/13 88  08/04/13 80  06/27/12 90     Head and neck examination: Grossly unremarkable. Nodes: Without palpable cervical, supraclavicular, or axillary lymphadenopathy. Chest: Lungs clear. Back: Without spinal or CVA tenderness. Breasts: There is a punctate biopsy wound along the upper inner quadrant of the left breast at 11:00. No discreet masses are appreciated. Right breast without masses or lesions. Abdomen without hepatomegaly. Extremities: Without edema.   LABORATORY DATA:  Lab Results  Component Value Date    WBC 7.4 11/01/2013   HGB 13.9 11/01/2013   HCT 41.5 11/01/2013   MCV 92.9 11/01/2013   PLT 240 11/01/2013   Lab Results  Component Value Date   NA 141 11/01/2013   K 3.8 11/01/2013   CL 104 08/04/2013   CO2 26 11/01/2013   Lab Results  Component Value Date   ALT 15 11/01/2013   AST 14 11/01/2013   ALKPHOS 50 11/01/2013   BILITOT 0.84 11/01/2013      IMPRESSION: Stage I (T1, N0, M0) invasive ductal carcinoma of the left breast. We discussed local treatment options which include mastectomy versus partial mastectomy followed by radiation therapy. She will have a sentinel lymph node biopsy. In view of her age we may lean towards giving her standard fractionation therapy instead of hypofractionated therapy. She may be a candidate for her deep inspiration breath-hold technology to avoid the heart. We discussed the potential acute and late toxicities of radiation therapy. She is interested in breast preservation. Dr. Humphrey Rolls will obtain Oncotype DX testing. Dr. Marlou Starks will remove her hormone delivery device (birth control) from her arm at the time of her surgery.   PLAN: I will see her postoperatively discussion of radiation therapy.  I spent 30 minutes minutes face to face with the patient and more than 50% of that time was spent in counseling and/or coordination of care.

## 2013-11-01 NOTE — Telephone Encounter (Signed)
Faxed [facility] to [facility].  Completed ROI and took to [facility] to scan.  

## 2013-11-01 NOTE — Progress Notes (Signed)
Checked in new patient with no financial issues. She has appt card and had to give her [facility] packet.

## 2013-11-02 ENCOUNTER — Telehealth: Payer: Self-pay | Admitting: Oncology

## 2013-11-02 NOTE — Telephone Encounter (Signed)
, °

## 2013-11-09 ENCOUNTER — Telehealth: Payer: Self-pay | Admitting: *Deleted

## 2013-11-09 NOTE — Telephone Encounter (Signed)
Called and spoke with patient from [facility] 11/01/13.  Changed patient's appointment due to oncotype results will not be back.  Confirmed appointment for 12/12/13 at 230pm with Dr. Humphrey Rolls. Encouraged patient to call with any needs.

## 2013-11-09 NOTE — Telephone Encounter (Signed)
Called patient to inform of appt. To see Dr. Valere Dross on 12-14-13, lvm for a return call

## 2013-11-14 ENCOUNTER — Encounter: Payer: Self-pay | Admitting: Oncology

## 2013-11-14 NOTE — Progress Notes (Signed)
Patient left message. I called and advised her of 600.00 and 400.00 grant. She will be a radiation patient so advised her of Levada Dy and Ailene Ravel but I could get her grant form when she comes in and to bring in proof of income for her and hubby.

## 2013-11-17 ENCOUNTER — Encounter (HOSPITAL_BASED_OUTPATIENT_CLINIC_OR_DEPARTMENT_OTHER): Payer: Self-pay | Admitting: *Deleted

## 2013-11-17 NOTE — Progress Notes (Signed)
No labs needed-had labs cc-11/02/13

## 2013-11-22 ENCOUNTER — Encounter (HOSPITAL_BASED_OUTPATIENT_CLINIC_OR_DEPARTMENT_OTHER): Payer: Self-pay | Admitting: *Deleted

## 2013-11-22 ENCOUNTER — Encounter (HOSPITAL_COMMUNITY)
Admission: RE | Admit: 2013-11-22 | Discharge: 2013-11-22 | Disposition: A | Discharge status: Home / Self Care | Payer: BC Managed Care – PPO | Source: Ambulatory Visit | Attending: General Surgery | Admitting: General Surgery

## 2013-11-22 ENCOUNTER — Encounter (HOSPITAL_BASED_OUTPATIENT_CLINIC_OR_DEPARTMENT_OTHER): Payer: BC Managed Care – PPO | Admitting: Anesthesiology

## 2013-11-22 ENCOUNTER — Encounter (HOSPITAL_BASED_OUTPATIENT_CLINIC_OR_DEPARTMENT_OTHER)
Admission: RE | Disposition: A | Discharge status: Home / Self Care | Payer: Self-pay | Source: Ambulatory Visit | Attending: General Surgery

## 2013-11-22 ENCOUNTER — Ambulatory Visit (HOSPITAL_BASED_OUTPATIENT_CLINIC_OR_DEPARTMENT_OTHER): Payer: BC Managed Care – PPO | Admitting: Anesthesiology

## 2013-11-22 ENCOUNTER — Ambulatory Visit (HOSPITAL_BASED_OUTPATIENT_CLINIC_OR_DEPARTMENT_OTHER)
Admission: RE | Admit: 2013-11-22 | Discharge: 2013-11-22 | Disposition: A | Discharge status: Home / Self Care | Payer: BC Managed Care – PPO | Source: Ambulatory Visit | Attending: General Surgery | Admitting: General Surgery

## 2013-11-22 ENCOUNTER — Ambulatory Visit
Admission: RE | Admit: 2013-11-22 | Discharge: 2013-11-22 | Disposition: A | Discharge status: Home / Self Care | Payer: BC Managed Care – PPO | Source: Ambulatory Visit | Attending: General Surgery | Admitting: General Surgery

## 2013-11-22 DIAGNOSIS — Z170 Estrogen receptor positive status [ER+]: Secondary | ICD-10-CM | POA: Insufficient documentation

## 2013-11-22 DIAGNOSIS — C50212 Malignant neoplasm of upper-inner quadrant of left female breast: Secondary | ICD-10-CM

## 2013-11-22 DIAGNOSIS — C50219 Malignant neoplasm of upper-inner quadrant of unspecified female breast: Secondary | ICD-10-CM | POA: Insufficient documentation

## 2013-11-22 DIAGNOSIS — Z975 Presence of (intrauterine) contraceptive device: Secondary | ICD-10-CM

## 2013-11-22 DIAGNOSIS — C50919 Malignant neoplasm of unspecified site of unspecified female breast: Secondary | ICD-10-CM

## 2013-11-22 DIAGNOSIS — Z8541 Personal history of malignant neoplasm of cervix uteri: Secondary | ICD-10-CM | POA: Insufficient documentation

## 2013-11-22 DIAGNOSIS — F17200 Nicotine dependence, unspecified, uncomplicated: Secondary | ICD-10-CM | POA: Insufficient documentation

## 2013-11-22 HISTORY — PX: FOREIGN BODY REMOVAL: SHX962

## 2013-11-22 HISTORY — PX: PARTIAL MASTECTOMY WITH NEEDLE LOCALIZATION AND AXILLARY SENTINEL LYMPH NODE BX: SHX6009

## 2013-11-22 HISTORY — DX: Presence of dental prosthetic device (complete) (partial): Z97.2

## 2013-11-22 LAB — POCT HEMOGLOBIN-HEMACUE: Hemoglobin: 14.8 g/dL (ref 12.0–15.0)

## 2013-11-22 SURGERY — PARTIAL MASTECTOMY WITH NEEDLE LOCALIZATION AND AXILLARY SENTINEL LYMPH NODE BX
Anesthesia: General | Site: Breast | Laterality: Left

## 2013-11-22 MED ORDER — DEXAMETHASONE SODIUM PHOSPHATE 4 MG/ML IJ SOLN
INTRAMUSCULAR | Status: DC | PRN
  Administered 2013-11-22: 10 mg via INTRAVENOUS

## 2013-11-22 MED ORDER — FENTANYL CITRATE 0.05 MG/ML IJ SOLN
INTRAMUSCULAR | Status: DC | PRN
  Administered 2013-11-22: 50 ug via INTRAVENOUS
  Administered 2013-11-22: 100 ug via INTRAVENOUS
  Administered 2013-11-22: 50 ug via INTRAVENOUS

## 2013-11-22 MED ORDER — OXYCODONE HCL 5 MG PO TABS
5.0000 mg | ORAL_TABLET | Freq: Once | ORAL | Status: AC | PRN
  Administered 2013-11-22: 5 mg via ORAL

## 2013-11-22 MED ORDER — TECHNETIUM TC 99M SULFUR COLLOID FILTERED
1.0000 | Freq: Once | INTRAVENOUS | Status: AC | PRN
  Administered 2013-11-22: 1 via INTRADERMAL

## 2013-11-22 MED ORDER — METHYLENE BLUE 1 % INJ SOLN
INTRAMUSCULAR | Status: AC
  Filled 2013-11-22: qty 10

## 2013-11-22 MED ORDER — SODIUM CHLORIDE 0.9 % IJ SOLN
INTRAMUSCULAR | Status: DC | PRN
  Administered 2013-11-22: 11:00:00

## 2013-11-22 MED ORDER — BUPIVACAINE HCL (PF) 0.25 % IJ SOLN
INTRAMUSCULAR | Status: AC
  Filled 2013-11-22: qty 30

## 2013-11-22 MED ORDER — LACTATED RINGERS IV SOLN
INTRAVENOUS | Status: DC
  Administered 2013-11-22 (×2): via INTRAVENOUS

## 2013-11-22 MED ORDER — MEPERIDINE HCL 25 MG/ML IJ SOLN: 6.2500 mg | INTRAMUSCULAR | Status: DC | PRN

## 2013-11-22 MED ORDER — OXYCODONE HCL 5 MG/5ML PO SOLN: 5.0000 mg | Freq: Once | ORAL | Status: AC | PRN

## 2013-11-22 MED ORDER — CEFAZOLIN SODIUM-DEXTROSE 2-3 GM-% IV SOLR
2.0000 g | INTRAVENOUS | Status: AC
  Administered 2013-11-22: 2 g via INTRAVENOUS

## 2013-11-22 MED ORDER — CHLORHEXIDINE GLUCONATE 4 % EX LIQD: 1.0000 "application " | Freq: Once | CUTANEOUS | Status: DC

## 2013-11-22 MED ORDER — OXYCODONE-ACETAMINOPHEN 5-325 MG PO TABS: 1.0000 | ORAL_TABLET | ORAL | Status: DC | PRN

## 2013-11-22 MED ORDER — SODIUM CHLORIDE 0.9 % IJ SOLN
INTRAMUSCULAR | Status: AC
Start: 2013-11-22 — End: 2013-11-22
  Filled 2013-11-22: qty 10

## 2013-11-22 MED ORDER — BUPIVACAINE HCL (PF) 0.25 % IJ SOLN
INTRAMUSCULAR | Status: DC | PRN
  Administered 2013-11-22: 20 mL

## 2013-11-22 MED ORDER — FENTANYL CITRATE 0.05 MG/ML IJ SOLN
50.0000 ug | INTRAMUSCULAR | Status: DC | PRN
  Administered 2013-11-22: 50 ug via INTRAVENOUS

## 2013-11-22 MED ORDER — HYDROMORPHONE HCL PF 1 MG/ML IJ SOLN
INTRAMUSCULAR | Status: AC
  Filled 2013-11-22: qty 1

## 2013-11-22 MED ORDER — HYDROMORPHONE HCL PF 1 MG/ML IJ SOLN
0.2500 mg | INTRAMUSCULAR | Status: DC | PRN
  Administered 2013-11-22 (×2): 0.5 mg via INTRAVENOUS

## 2013-11-22 MED ORDER — PROPOFOL 10 MG/ML IV BOLUS
INTRAVENOUS | Status: DC | PRN
  Administered 2013-11-22: 140 mg via INTRAVENOUS

## 2013-11-22 MED ORDER — ONDANSETRON HCL 4 MG/2ML IJ SOLN: 4.0000 mg | Freq: Once | INTRAMUSCULAR | Status: DC | PRN

## 2013-11-22 MED ORDER — FENTANYL CITRATE 0.05 MG/ML IJ SOLN
INTRAMUSCULAR | Status: AC
  Filled 2013-11-22: qty 6

## 2013-11-22 MED ORDER — FENTANYL CITRATE 0.05 MG/ML IJ SOLN
INTRAMUSCULAR | Status: AC
  Filled 2013-11-22: qty 2

## 2013-11-22 MED ORDER — MIDAZOLAM HCL 2 MG/2ML IJ SOLN
1.0000 mg | INTRAMUSCULAR | Status: DC | PRN
  Administered 2013-11-22: 1 mg via INTRAVENOUS

## 2013-11-22 MED ORDER — LIDOCAINE HCL (CARDIAC) 20 MG/ML IV SOLN
INTRAVENOUS | Status: DC | PRN
  Administered 2013-11-22: 60 mg via INTRAVENOUS

## 2013-11-22 MED ORDER — ONDANSETRON HCL 4 MG/2ML IJ SOLN
INTRAMUSCULAR | Status: DC | PRN
  Administered 2013-11-22: 4 mg via INTRAVENOUS

## 2013-11-22 MED ORDER — OXYCODONE HCL 5 MG PO TABS
ORAL_TABLET | ORAL | Status: AC
  Filled 2013-11-22: qty 1

## 2013-11-22 MED ORDER — MIDAZOLAM HCL 2 MG/2ML IJ SOLN
INTRAMUSCULAR | Status: AC
  Filled 2013-11-22: qty 2

## 2013-11-22 MED ORDER — MIDAZOLAM HCL 5 MG/5ML IJ SOLN
INTRAMUSCULAR | Status: DC | PRN
  Administered 2013-11-22: 1 mg via INTRAVENOUS

## 2013-11-22 SURGICAL SUPPLY — 51 items
APPLIER CLIP 11 MED OPEN (CLIP) ×4
BLADE SURG 10 STRL SS (BLADE) IMPLANT
BLADE SURG 15 STRL LF DISP TIS (BLADE) ×2 IMPLANT
BLADE SURG 15 STRL SS (BLADE) ×2
CANISTER SUCT 1200ML W/VALVE (MISCELLANEOUS) ×4 IMPLANT
CHLORAPREP W/TINT 26ML (MISCELLANEOUS) ×4 IMPLANT
CLIP APPLIE 11 MED OPEN (CLIP) ×2 IMPLANT
COVER MAYO STAND STRL (DRAPES) ×4 IMPLANT
COVER PROBE W GEL 5X96 (DRAPES) ×4 IMPLANT
COVER TABLE BACK 60X90 (DRAPES) ×4 IMPLANT
DECANTER SPIKE VIAL GLASS SM (MISCELLANEOUS) IMPLANT
DERMABOND ADVANCED (GAUZE/BANDAGES/DRESSINGS) ×4
DERMABOND ADVANCED .7 DNX12 (GAUZE/BANDAGES/DRESSINGS) ×4 IMPLANT
DEVICE DUBIN W/COMP PLATE 8390 (MISCELLANEOUS) ×4 IMPLANT
DRAIN CHANNEL 19F RND (DRAIN) IMPLANT
DRAPE LAPAROSCOPIC ABDOMINAL (DRAPES) ×4 IMPLANT
DRAPE UTILITY XL STRL (DRAPES) ×4 IMPLANT
ELECT COATED BLADE 2.86 ST (ELECTRODE) ×4 IMPLANT
ELECT REM PT RETURN 9FT ADLT (ELECTROSURGICAL) ×4
ELECTRODE REM PT RTRN 9FT ADLT (ELECTROSURGICAL) ×2 IMPLANT
EVACUATOR SILICONE 100CC (DRAIN) IMPLANT
GLOVE BIO SURGEON STRL SZ7.5 (GLOVE) ×8 IMPLANT
GLOVE BIOGEL PI IND STRL 7.0 (GLOVE) ×2 IMPLANT
GLOVE BIOGEL PI INDICATOR 7.0 (GLOVE) ×2
GLOVE ECLIPSE 6.5 STRL STRAW (GLOVE) ×4 IMPLANT
GOWN STRL REUS W/ TWL LRG LVL3 (GOWN DISPOSABLE) ×4 IMPLANT
GOWN STRL REUS W/TWL LRG LVL3 (GOWN DISPOSABLE) ×4
KIT MARKER MARGIN INK (KITS) ×4 IMPLANT
NDL SAFETY ECLIPSE 18X1.5 (NEEDLE) ×2 IMPLANT
NEEDLE HYPO 18GX1.5 SHARP (NEEDLE) ×2
NEEDLE HYPO 25X1 1.5 SAFETY (NEEDLE) ×8 IMPLANT
NS IRRIG 1000ML POUR BTL (IV SOLUTION) ×4 IMPLANT
PACK BASIN DAY SURGERY FS (CUSTOM PROCEDURE TRAY) ×4 IMPLANT
PENCIL BUTTON HOLSTER BLD 10FT (ELECTRODE) ×4 IMPLANT
PIN SAFETY STERILE (MISCELLANEOUS) IMPLANT
SLEEVE SCD COMPRESS KNEE MED (MISCELLANEOUS) ×4 IMPLANT
SPONGE LAP 18X18 X RAY DECT (DISPOSABLE) ×4 IMPLANT
SPONGE LAP 4X18 X RAY DECT (DISPOSABLE) IMPLANT
STAPLER VISISTAT 35W (STAPLE) IMPLANT
SUT ETHILON 3 0 FSL (SUTURE) IMPLANT
SUT MON AB 4-0 PC3 18 (SUTURE) ×8 IMPLANT
SUT SILK 3 0 PS 1 (SUTURE) IMPLANT
SUT VIC AB 3-0 54X BRD REEL (SUTURE) ×2 IMPLANT
SUT VIC AB 3-0 BRD 54 (SUTURE) ×2
SUT VICRYL 3-0 CR8 SH (SUTURE) ×4 IMPLANT
SYR CONTROL 10ML LL (SYRINGE) ×8 IMPLANT
TOWEL OR 17X24 6PK STRL BLUE (TOWEL DISPOSABLE) ×8 IMPLANT
TOWEL OR NON WOVEN STRL DISP B (DISPOSABLE) IMPLANT
TUBE CONNECTING 20'X1/4 (TUBING) ×1
TUBE CONNECTING 20X1/4 (TUBING) ×3 IMPLANT
YANKAUER SUCT BULB TIP NO VENT (SUCTIONS) ×4 IMPLANT

## 2013-11-22 NOTE — Anesthesia Preprocedure Evaluation (Signed)
Anesthesia Evaluation  Patient identified by MRN, date of birth, ID band Patient awake    Reviewed: Allergy & Precautions, H&P , NPO status , Patient's Chart, lab work & pertinent test results  Airway Mallampati: I TM Distance: >3 FB Neck ROM: Full    Dental   Pulmonary Current Smoker,          Cardiovascular     Neuro/Psych    GI/Hepatic   Endo/Other    Renal/GU      Musculoskeletal   Abdominal   Peds  Hematology   Anesthesia Other Findings   Reproductive/Obstetrics                           Anesthesia Physical Anesthesia Plan  ASA: II  Anesthesia Plan: General   Post-op Pain Management:    Induction: Intravenous  Airway Management Planned: LMA  Additional Equipment:   Intra-op Plan:   Post-operative Plan: Extubation in OR  Informed Consent: I have reviewed the patients History and Physical, chart, labs and discussed the procedure including the risks, benefits and alternatives for the proposed anesthesia with the patient or authorized representative who has indicated his/her understanding and acceptance.     Plan Discussed with: CRNA and Surgeon  Anesthesia Plan Comments:         Anesthesia Quick Evaluation  

## 2013-11-22 NOTE — Anesthesia Procedure Notes (Signed)
Procedure Name: LMA Insertion Date/Time: 11/22/2013 10:55 AM Performed by: Baxter Flattery Pre-anesthesia Checklist: Patient identified, Emergency Drugs available, Suction available and Patient being monitored Patient Re-evaluated:Patient Re-evaluated prior to inductionOxygen Delivery Method: Circle System Utilized Preoxygenation: Pre-oxygenation with 100% oxygen Intubation Type: IV induction Ventilation: Mask ventilation without difficulty LMA: LMA inserted LMA Size: 4.0 Number of attempts: 1 Airway Equipment and Method: bite block Placement Confirmation: positive ETCO2 and breath sounds checked- equal and bilateral Tube secured with: Tape Dental Injury: Teeth and Oropharynx as per pre-operative assessment

## 2013-11-22 NOTE — Interval H&P Note (Signed)
History and Physical Interval Note:  11/22/2013 10:35 AM  Olivia Gross  has presented today for surgery, with the diagnosis of left breast cancer   The various methods of treatment have been discussed with the patient and family. After consideration of risks, benefits and other options for treatment, the patient has consented to  Procedure(s): PARTIAL MASTECTOMY WITH NEEDLE LOCALIZATION AND AXILLARY SENTINEL LYMPH NODE BX (Left) removal of implanon from left upper arm  (Left) as a surgical intervention .  The patient's history has been reviewed, patient examined, no change in status, stable for surgery.  I have reviewed the patient's chart and labs.  Questions were answered to the patient's satisfaction.     TOTH III,Caelyn Route S

## 2013-11-22 NOTE — Anesthesia Postprocedure Evaluation (Signed)
Anesthesia Post Note  Patient: Olivia Gross  Procedure(s) Performed: Procedure(s) (LRB): PARTIAL MASTECTOMY WITH NEEDLE LOCALIZATION AND AXILLARY SENTINEL LYMPH NODE BIOPSY (Left) removal of implanon from left upper arm  (Left)  Anesthesia type: general  Patient location: PACU  Post pain: Pain level controlled  Post assessment: Patient's Cardiovascular Status Stable  Last Vitals:  Filed Vitals:   11/22/13 1323  BP:   Pulse: 79  Temp:   Resp: 12    Post vital signs: Reviewed and stable  Level of consciousness: sedated  Complications: No apparent anesthesia complications

## 2013-11-22 NOTE — Progress Notes (Signed)
Emotional support during breast injections °

## 2013-11-22 NOTE — Transfer of Care (Signed)
Immediate Anesthesia Transfer of Care Note  Patient: Olivia Gross  Procedure(s) Performed: Procedure(s): PARTIAL MASTECTOMY WITH NEEDLE LOCALIZATION AND AXILLARY SENTINEL LYMPH NODE BIOPSY (Left) removal of implanon from left upper arm  (Left)  Patient Location: PACU  Anesthesia Type:General  Level of Consciousness: awake, alert  and oriented  Airway & Oxygen Therapy: Patient Spontanous Breathing and Patient connected to face mask oxygen  Post-op Assessment: Report given to PACU RN, Post -op Vital signs reviewed and stable and Patient moving all extremities  Post vital signs: Reviewed and stable  Complications: No apparent anesthesia complications

## 2013-11-22 NOTE — Discharge Instructions (Signed)

## 2013-11-22 NOTE — H&P (View-Only) (Signed)
Patient ID: Olivia Gross, female   DOB: 03/20/1961, 52 y.o.   MRN: [id number]  No chief complaint on file.   HPI Olivia Gross is a 52 y.o. female.  We are asked to see the patient in consultation by Dr. Brown to evaluate her for a left-sided breast cancer. The patient is a 52-year-old white female who recently went for a routine screening mammogram. At that time she was found to have an abnormality in the upper inner left breast. She denies any breast pain. She denies any discharge from her nipple. This abnormality measured 8 mm by ultrasound and 1.3 cm by MRI. It was biopsied and came back as an invasive ductal cancer. She was ER and PR positive with a Ki-67 of 30% she is otherwise in good health. Of note she does have an Implanon device in her left upper arm For birth control. HPI  Past Medical History  Diagnosis Date  . Chicken pox   . Cervical cancer     History reviewed. No pertinent past surgical history.  Family History  Problem Relation Age of Onset  . Arthritis Mother   . Mental illness Maternal Aunt   . Heart disease Paternal Uncle   . Diabetes Maternal Grandmother   . Diabetes Maternal Grandfather     Social History History  Substance Use Topics  . Smoking status: Current Every Day Smoker -- 0.50 packs/day for 20 years    Types: Cigarettes  . Smokeless tobacco: Not on file  . Alcohol Use: Yes     Comment: occaisonally    No Known Allergies  Current Outpatient Prescriptions  Medication Sig Dispense Refill  . Multiple Vitamin (MULTIVITAMIN) tablet Take 1 tablet by mouth daily.      . naproxen sodium (ANAPROX) 220 MG tablet Take 220 mg by mouth daily as needed. Take for sinuses      . norethindrone-ethinyl estradiol (OVCON-50) 1-50 MG-MCG tablet Take 1 tablet by mouth daily.       No current facility-administered medications for this visit.    Review of Systems Review of Systems  Constitutional: Negative.   HENT: Negative.   Eyes: Negative.   Respiratory:  Negative.   Cardiovascular: Negative.   Gastrointestinal: Negative.   Endocrine: Negative.   Genitourinary: Negative.   Musculoskeletal: Negative.   Skin: Negative.   Allergic/Immunologic: Negative.   Neurological: Negative.   Hematological: Negative.   Psychiatric/Behavioral: Negative.     There were no vitals taken for this visit.  Physical Exam Physical Exam  Constitutional: She is oriented to person, place, and time. She appears well-developed and well-nourished.  HENT:  Head: Normocephalic and atraumatic.  Eyes: Conjunctivae and EOM are normal. Pupils are equal, round, and reactive to light.  Neck: Normal range of motion. Neck supple.  Cardiovascular: Normal rate, regular rhythm and normal heart sounds.   Pulmonary/Chest: Effort normal and breath sounds normal.  There is no palpable mass in either breast. There is no palpable axillary, supraclavicular, or cervical lymphadenopathy  Abdominal: Soft. Bowel sounds are normal.  Musculoskeletal: Normal range of motion.  In the ulnar aspect of the left upper arm she has a hormone delivery device implanted just under the skin that is palpable  Lymphadenopathy:    She has no cervical adenopathy.  Neurological: She is alert and oriented to person, place, and time.  Skin: Skin is warm and dry.  Psychiatric: She has a normal mood and affect. Her behavior is normal.    Data Reviewed As above  Assessment      The patient appears to have a small stage I invasive ductal cancer of the left breast. I've discussed with her in detail the different options for treatment. She favors breast conservation. I think this is a reasonable choice. I've discussed with her in detail the risks and benefits of the operation to remove the cancer as well as map out the lymph nodes including some of the technical aspects and she understands and wishes to proceed. she would also need to have the hormone delivery device removed from her left upper arm      Plan    Plan for a left breast wire localized lumpectomy and sentinel node mapping with removal of hormone delivery device from the left upper arm        TOTH III,Marion Rosenberry S 11/01/2013, 2:59 PM    

## 2013-11-22 NOTE — Op Note (Signed)
11/22/2013  12:30 PM  PATIENT:  Olivia Gross  53 y.o. female  PRE-OPERATIVE DIAGNOSIS:  left breast cancer   POST-OPERATIVE DIAGNOSIS:  left breast cancer   PROCEDURE:  Procedure(s): PARTIAL MASTECTOMY WITH NEEDLE LOCALIZATION AND AXILLARY SENTINEL LYMPH NODE BIOPSY (Left) removal of implanon from left upper arm  (Left)  SURGEON:  Surgeon(s) and Role:    * Merrie Roof, MD - Primary  PHYSICIAN ASSISTANT:   ASSISTANTS: none   ANESTHESIA:   general  EBL:  Total I/O In: 1400 [I.V.:1400] Out: -   BLOOD ADMINISTERED:none  DRAINS: none   LOCAL MEDICATIONS USED:  MARCAINE     SPECIMEN:  Source of Specimen:  left breast tissue and axillary sentinel node, implanon  DISPOSITION OF SPECIMEN:  PATHOLOGY  COUNTS:  YES  TOURNIQUET:  * No tourniquets in log *  DICTATION: .Dragon Dictation After informed consent was obtained the patient was brought to the operating room and placed in the supine position on the operating room table. After adequate induction of general anesthesia the patient's left chest, breast, and axillary area were prepped with ChloraPrep, allowed to dry, and draped in usual sterile manner. Earlier in the day the patient underwent injection of 1 mCi of technetium sulfur colloid in the subareolar position on the left. Also earlier in the day the patient underwent wire localization procedure and the wire was entering the left breast in the upper inner quadrant and headed laterally. At this point, 2 cc of methylene blue 3 cc of injectable saline were also injected in the subareolar position and the breast was massaged for several minutes. Attention was first turned to the left arm where her hormone delivery device was located. A small stab incision was made with a 15 blade knife overlying the tip of this device. The device was dissected free using tenotomy scissors. A hemostat was then used to grasp the device and remove it with gentle traction without difficulty. The  incision was then closed with an interrupted 4-0 Monocryl subcuticular stitch. Attention was then turned to the left axilla. A small transverse incision was made overlying the hot spot in the left axilla. This incision was carried through the skin and subcutaneous tissue sharply with electrocautery until the axilla was entered. The neoprobe was used to direct blunt hemostat dissection until a hot blue lymph node was identified. This lymph node was excised sharply with electrocautery and then the lymphatics were clamped with hemostats, divided, ligated with 3-0 Vicryl ties. Ex vivo counts on this node were approximately 200. No other hot, blue, or palpable lymph nodes were identified in the left axilla. The wound was irrigated with copious amounts of saline and infiltrated with quarter percent Marcaine. The deep layer the wound was closed with interrupted 3-0 Vicryl stitches. The skin was closed with a running 4-0 Monocryl subcuticular stitch. Attention was then turned to the left breast. A curvilinear transverse uterine incision was made overlying the path of the wire with a 15 blade knife. This included an ellipse of skin. The incision was carried through the skin and subcutaneous tissue sharply with electrocautery. Once into the breast tissue the path of the wire could be palpated. A circular portion of breast tissue was excised sharply around the path of the wire with the electrocautery. This dissection was carried all the way to the chest wall. Once the specimen was removed it was marked with the appropriate paint colors. A specimen radiograph showed the clip to be in the center of the  specimen. Intraoperative assessment of the specimen showed good margins around the tumor. Hemostasis was achieved using the Bovie electrocautery. The margin of the cavity was marked with clips. With the wound was irrigated with copious amounts of saline. The breast tissue was mobilized off the chest wall and then the breast tissue  was closed in multiple layers with interrupted 3-0 Vicryl stitches. The skin was then closed with interrupted 4-0 Monocryl subcuticular stitches. Dermabond dressings were applied. Patient tolerated procedure well. At the end of the case the needle sponge and instrument counts were correct. The patient was then awakened and taken to recovery in stable condition.  PLAN OF CARE: Discharge to home after PACU  PATIENT DISPOSITION:  PACU - hemodynamically stable.   Delay start of Pharmacological VTE agent (>24hrs) due to surgical blood loss or risk of bleeding: not applicable

## 2013-11-23 ENCOUNTER — Encounter (HOSPITAL_BASED_OUTPATIENT_CLINIC_OR_DEPARTMENT_OTHER): Payer: Self-pay | Admitting: General Surgery

## 2013-11-28 ENCOUNTER — Ambulatory Visit: Payer: BC Managed Care – PPO | Admitting: Oncology

## 2013-12-05 ENCOUNTER — Ambulatory Visit (INDEPENDENT_AMBULATORY_CARE_PROVIDER_SITE_OTHER): Payer: BC Managed Care – PPO | Admitting: General Surgery

## 2013-12-05 ENCOUNTER — Encounter (INDEPENDENT_AMBULATORY_CARE_PROVIDER_SITE_OTHER): Payer: Self-pay

## 2013-12-05 ENCOUNTER — Encounter (INDEPENDENT_AMBULATORY_CARE_PROVIDER_SITE_OTHER): Payer: Self-pay | Admitting: General Surgery

## 2013-12-05 VITALS — BP 138/100 | HR 80 | Temp 97.8°F | Resp 14 | Ht 65.0 in | Wt 163.0 lb

## 2013-12-05 DIAGNOSIS — C50219 Malignant neoplasm of upper-inner quadrant of unspecified female breast: Secondary | ICD-10-CM

## 2013-12-05 DIAGNOSIS — C50212 Malignant neoplasm of upper-inner quadrant of left female breast: Secondary | ICD-10-CM

## 2013-12-05 NOTE — Patient Instructions (Signed)
May return to normal activities but no heavy lifting for a couple more weeks

## 2013-12-05 NOTE — Progress Notes (Signed)
Subjective:     Patient ID: Olivia Gross, female   DOB: 1961/06/15, 53 y.o.   MRN: [id number]  HPI The patient is a 53 year old white female who is about 3 weeks status post left breast lumpectomy and negative sentinel node biopsy for a T1 C. N0 left breast cancer. She was strongly ER and PR positive and HER-2/neu negative. Her Ki-67 was 25%. She tolerated the surgery well. Her only complaint is of some burning and numbness on the back of the left arm  Review of Systems     Objective:   Physical Exam On exam her left breast and axillary incisions are healing nicely with no sign of infection or significant seroma.    Assessment:     The patient is 3 weeks status post left breast lumpectomy for breast cancer     Plan:     At this point she may return to her normal activities but no heavy lifting for a couple more weeks. She has appointments later this week with medical and radiation oncology. I will plan to see her back in about 2 months to check her progress

## 2013-12-07 ENCOUNTER — Encounter (INDEPENDENT_AMBULATORY_CARE_PROVIDER_SITE_OTHER): Payer: BC Managed Care – PPO | Admitting: General Surgery

## 2013-12-07 ENCOUNTER — Encounter: Payer: Self-pay | Admitting: *Deleted

## 2013-12-07 ENCOUNTER — Telehealth: Payer: Self-pay | Admitting: *Deleted

## 2013-12-07 NOTE — [facility] Navigator Note (Signed)
Oncotype ordered.  Faxed requisition to pathology and confirmed receipt with Jeannie.  Faxed PAC to [facility].

## 2013-12-07 NOTE — Telephone Encounter (Signed)
Called and spoke with patient to reschedule her appointment due to oncotype results.  Confirmed appointment for 12/21/13 at 3pm.

## 2013-12-12 ENCOUNTER — Ambulatory Visit: Payer: BC Managed Care – PPO | Admitting: Oncology

## 2013-12-12 ENCOUNTER — Encounter: Payer: Self-pay | Admitting: Radiation Oncology

## 2013-12-12 DIAGNOSIS — C50919 Malignant neoplasm of unspecified site of unspecified female breast: Secondary | ICD-10-CM | POA: Insufficient documentation

## 2013-12-12 NOTE — Progress Notes (Signed)
Location of Breast Cancer: left 9:00 o'clock  Histology per Pathology Report:  11/22/13 Diagnosis 1. Lymph node, sentinel, biopsy, Left axillary #1 - ONE LYMPH NODE,. NEGATIVE FOR TUMOR (0/1). 2. Breast, lumpectomy, Left - INVASIVE DUCTAL CARCINOMA, SEE COMMENT. - INVASIVE TUMOR IS 1 CM FROM THE NEAREST MARGIN (ANTERIOR). - NEGATIVE FOR LYMPHOVASCULAR INVASION.  10/25/13 Diagnosis Breast, left, needle core biopsy, 10 o'clock - INVASIVE DUCTAL CARCINOMA.  Receptor Status: ER(89%), PR (89%), Her2-neu (-)  Did patient present with symptoms (if so, please note symptoms) or was this found on screening mammography?: screening mammogram  Past/Anticipated interventions by surgeon, if any: 11/22/13 left lumpectomy, SN biopsy  Past/Anticipated interventions by medical oncology, if any: Chemotherapy , Dr Humphrey Rolls: adjuvant antiestrogen therapy consisting of tamoxifen, next appt w/Dr Humphrey Rolls on 12/21/13.  Lymphedema issues, if any:  no  Pain issues, if any:  "burning sensation of underside of left upper arm"  SAFETY ISSUES:  Prior radiation? no  Pacemaker/ICD? no  Possible current pregnancy? no  Is the patient on methotrexate?  no  Current Complaints / other details:  Married, works at [employer] age 76, [place], first live birth age 33, pt states she is perimenopausal     Jacobo Forest, Verneita Griffes, RN 12/12/2013,2:17 PM

## 2013-12-14 ENCOUNTER — Ambulatory Visit
Admission: RE | Admit: 2013-12-14 | Discharge: 2013-12-14 | Disposition: A | Discharge status: Home / Self Care | Payer: BC Managed Care – PPO | Source: Ambulatory Visit | Attending: Radiation Oncology | Admitting: Radiation Oncology

## 2013-12-14 ENCOUNTER — Encounter: Payer: Self-pay | Admitting: Radiation Oncology

## 2013-12-14 VITALS — BP 166/99 | HR 76 | Temp 97.6°F | Resp 20 | Wt 165.0 lb

## 2013-12-14 DIAGNOSIS — C50212 Malignant neoplasm of upper-inner quadrant of left female breast: Secondary | ICD-10-CM

## 2013-12-14 DIAGNOSIS — C50219 Malignant neoplasm of upper-inner quadrant of unspecified female breast: Secondary | ICD-10-CM | POA: Insufficient documentation

## 2013-12-14 DIAGNOSIS — F17200 Nicotine dependence, unspecified, uncomplicated: Secondary | ICD-10-CM | POA: Insufficient documentation

## 2013-12-14 DIAGNOSIS — Z170 Estrogen receptor positive status [ER+]: Secondary | ICD-10-CM | POA: Insufficient documentation

## 2013-12-14 HISTORY — DX: Malignant neoplasm of unspecified site of unspecified female breast: C50.919

## 2013-12-14 NOTE — Progress Notes (Signed)
CC: Dr. Autumn Messing III, Dr. Marcy Panning, Dr. 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 At the time of a screening mammogram at the [facility] on 09/06/2013 there was the question of a left breast mass. Additional views on 10/12/2013 showed a mass at 9:00 approximately 7-8 mm in size. Ultrasound showed a mass at 10:00 measuring 0.8 x 0.6 x 0.6 cm. The left axilla appear to be normal on ultrasound. Ultrasound-guided biopsy on 10/25/2013 was diagnostic for invasive ductal carcinoma. This was ER/PR positive at 89% with an elevated Ki-67 of 25%. HER-2/neu is pending. Breast MR on 10/30/2013 showed a solitary 1.3 x 1.1 x 1.9 cm mass within the upper breast consistent with known malignancy. Axilla was benign. Of note is that she did have a hormone delivery device for birth control , and this was removed at the time of her left partial mastectomy and sentinel lymph node biopsy on 11/22/2013. She is found to have a 1.6 cm invasive ductal carcinoma with the closest margin being 1.07 m. One sentinel lymph node was free of metastatic disease. Dr. Humphrey Rolls sent off Oncotype DX testing on March 12. She will see her for a followup visit on March 26. She is without complaints today.  Physical examination: Alert and oriented. Filed Vitals:   12/14/13 0800  BP: 166/99  Pulse: 76  Temp: 97.6 F (36.4 C)  Resp: 20   Head and neck examination: Grossly unremarkable. Nodes: Without palpable cervical, supraclavicular, or axillary lymphadenopathy. Chest: Lungs clear. Breasts: There is a partial mastectomy wound along the upper inner quadrant of the left breast from 9 to 10:00. There is a focal area of induration at 12:00 from her previous biopsy wound. The remainder of the breast is without masses or lesions. Right breast  without masses or lesions. Abdomen: Without hepatomegaly. Extremities: Without edema.  Impression: Pathologic stage I (T1, N0, M0) invasive ductal carcinoma of the left breast. She desires breast preservation, and she is an excellent candidate. In view of her relatively young age, I favor standard fractionation with plans to deliver 5000 cGy in 25 sessions (no boost). We discussed the potential acute and late toxicities of radiation therapy. We also discussed the possibility of utilizing deep inspiration breath-hold technology to avoid cardiac irradiation. I also advised her to stop smoking. We will await the results of her Oncotype DX testing to determine whether not she would benefit from adjuvant chemotherapy. If she does not have chemotherapy and I'll have her return for simulation/treatment planning on April 6. Consent is signed today.  30 minutes was spent face-to-face with the patient, primarily counseling the patient and coordinating her care.

## 2013-12-14 NOTE — Progress Notes (Signed)
Please see the Nurse Progress Note in the MD Initial Consult Encounter for this patient. 

## 2013-12-18 ENCOUNTER — Encounter: Payer: Self-pay | Admitting: *Deleted

## 2013-12-18 ENCOUNTER — Encounter (HOSPITAL_COMMUNITY): Payer: Self-pay

## 2013-12-18 NOTE — Progress Notes (Signed)
Received Oncotype Dx results of 17.  Placed copy in Dr. Laurelyn Sickle box and took a copy to HIM to scan.

## 2013-12-21 ENCOUNTER — Encounter: Payer: Self-pay | Admitting: Oncology

## 2013-12-21 ENCOUNTER — Telehealth: Payer: Self-pay | Admitting: Oncology

## 2013-12-21 ENCOUNTER — Ambulatory Visit (HOSPITAL_BASED_OUTPATIENT_CLINIC_OR_DEPARTMENT_OTHER): Payer: BC Managed Care – PPO | Admitting: Oncology

## 2013-12-21 VITALS — BP 156/96 | HR 80 | Temp 98.2°F | Resp 18 | Ht 65.0 in | Wt 163.4 lb

## 2013-12-21 DIAGNOSIS — C50219 Malignant neoplasm of upper-inner quadrant of unspecified female breast: Secondary | ICD-10-CM

## 2013-12-21 DIAGNOSIS — F17200 Nicotine dependence, unspecified, uncomplicated: Secondary | ICD-10-CM

## 2013-12-21 DIAGNOSIS — C50212 Malignant neoplasm of upper-inner quadrant of left female breast: Secondary | ICD-10-CM

## 2013-12-21 DIAGNOSIS — Z170 Estrogen receptor positive status [ER+]: Secondary | ICD-10-CM

## 2013-12-21 DIAGNOSIS — Z720 Tobacco use: Secondary | ICD-10-CM

## 2013-12-21 NOTE — Progress Notes (Signed)
Olivia Gross OFFICE PROGRESS NOTE  Patient Care Team: Midge Minium, MD as PCP - General (Family Medicine) Dr. Autumn Messing  Dr. Arloa Koh  DIAGNOSIS: 53 year old female with new diagnosis of T1 NX (stage I) invasive ductal carcinoma of the left breast diagnosed October 25, 2013  STAGE:  Breast cancer of upper-inner quadrant of left female breast  Primary site: Breast (Left)  Staging method: AJCC 7th Edition  Clinical: Stage IA (T1c, N0, cM0)  Summary: Stage IA (T1c, N0, cM0)   SUMMARY OF ONCOLOGIC HISTORY: #1screening mammogram performed on 09/06/2013 that revealed a possible mass in the left breast. In 10/12/2013 she had spot compression views performed that was consistent with a true mass lying medially near the 9:00 position measuring 7-8 mm in size. Ultrasound showed a lobulated hypoechoic mass in the 10:00 position 10 cm from the nipple measuring 8 mm x 6 mm x 6.6 mm. Patient had a biopsy performed on 10/25/2013 the final pathology revealed grade 1 invasive ductal carcinoma that was ER positive PR positive with a proliferation marker Ki-67 30% and HER-2/neu receptor negative. Patient had MRI of the breasts performed on 10/30/2013 in the right breast there were no masses or abnormal enhancement. In the left breast within the upper-outer quadrant there was an enhancing mass associated with clip artifact measuring 1.3 x 1.1 x 1.9 cm. No abnormal appearing lymph nodes were identified.  #2 status post left lumpectomy with sentinel lymph node biopsy performed 11/22/2013. The final pathology revealed  Diagnosis 1. Lymph node, sentinel, biopsy, Left axillary #1 - ONE LYMPH NODE,. NEGATIVE FOR TUMOR (0/1). 1 of 4 FINAL for Olivia Gross, Olivia Gross ([id number]) Diagnosis(continued) 2. Breast, lumpectomy, Left - INVASIVE DUCTAL CARCINOMA, SEE COMMENT. - INVASIVE TUMOR IS 1 CM FROM THE NEAREST MARGIN (ANTERIOR). - NEGATIVE FOR LYMPHOVASCULAR INVASION. - SEE TUMOR SYNOPTIC TEMPLATE  BELOW. Microscopic Comment 2. BREAST, INVASIVE TUMOR, WITH LYMPH NODE SAMPLING Specimen, including laterality and lymph node sampling (sentinel, non-sentinel): Left breast with sentinel lymph node sampling Procedure: Lumpectomy Histologic type: Ductal Grade: II of III Tubule formation: 2 Nuclear pleomorphism: 2 Mitotic: 2 Tumor size (gross measurement): 1.6 cm Margins: Invasive, distance to closest margin: 1.0 cm In-situ, distance to closest margin: N/A If margin positive, focally or broadly: N/A Lymphovascular invasion: Absent Ductal carcinoma in situ: Absent Grade: N/A Extensive intraductal component: N/A Lobular neoplasia: Absent Tumor focality: Unifocal Treatment effect: None If present, treatment effect in breast tissue, lymph nodes or both: N/A Extent of tumor: Skin: N/A Nipple: N/A Skeletal muscle: N/A Lymph nodes: Examined: 1 Sentinel 0 Non-sentinel 1 Total Lymph nodes with metastasis: 0 Isolated tumor cells (< 0.2 mm): N/A Micrometastasis: (> 0.2 mm and < 2.0 mm): N/A Macrometastasis: (> 2.0 mm): N/A Extracapsular extension: N/A Breast prognostic profile: Estrogen receptor: Not repeated, previous study demonstrated 89% positivity ([id number]) Progesterone receptor: Not repeated, previous study demonstrated 89% positivity ([id number]). Her 2 neu: Repeated, previous study demonstrated no amplification ([id number]). Ki-67: Not repeated, previous study demonstrated 25% proliferation rate ([id number]) Non-neoplastic breast: Previous biopsy site and fibrocystic change. TNM: pT1c, pN0, pMX  #3 Oncotype DX testing performed on the tumor revealed: The patient's recurrence score is 17. Those patients who had a recurrence score of 17 had an average rate of distant recurrence of 11%. Giving her a low risk score. She does not require chemotherapy. She will proceed with radiation therapy first. For now we will plan on doing antiestrogen therapy adjuvantly. We discussed  tamoxifen currently     INTERVAL HISTORY: Olivia  Gross 53 y.o. female returns for followup visit post lumpectomy. She is healing well. She is doing well she has no complaints. Unfortunately she still continues to smoke. She is very minimal pain at the lumpectomy site. She has noticed a little bump that may be from the injection of the radioactivity. Otherwise no fevers chills night sweats headaches shortness of breath chest pains palpitations no peripheral paresthesias no myalgias or arthralgias. Patient did have her birth control depot taken out. Remainder of the 10 point review of systems is negative and this below  I have reviewed the past medical history, past surgical history, social history and family history with the patient and they are unchanged from previous note.  ALLERGIES:  has No Known Allergies.  MEDICATIONS:  Current Outpatient Prescriptions  Medication Sig Dispense Refill  . Multiple Vitamin (MULTIVITAMIN) tablet Take 1 tablet by mouth daily.      . naproxen sodium (ANAPROX) 220 MG tablet Take 220 mg by mouth daily as needed. Take for sinuses      . psyllium (METAMUCIL) 58.6 % powder Take 1 packet by mouth daily.       No current facility-administered medications for this visit.    REVIEW OF SYSTEMS:   Constitutional: Denies fevers, chills or abnormal weight loss Eyes: Denies blurriness of vision Ears, nose, mouth, throat, and face: Denies mucositis or sore throat Respiratory: Denies cough, dyspnea or wheezes Cardiovascular: Denies palpitation, chest discomfort or lower extremity swelling Gastrointestinal:  Denies nausea, heartburn or change in bowel habits Skin: Denies abnormal skin rashes Lymphatics: Denies new lymphadenopathy or easy bruising Neurological:Denies numbness, tingling or new weaknesses Behavioral/Psych: Mood is stable, no new changes  All other systems were reviewed with the patient and are negative.  PHYSICAL EXAMINATION: ECOG PERFORMANCE STATUS: 0  - Asymptomatic  Filed Vitals:   12/21/13 1507  BP: 156/96  Pulse: 80  Temp: 98.2 F (36.8 C)  Resp: 18   Filed Weights   12/21/13 1507  Weight: 163 lb 6.4 oz (74.118 kg)    GENERAL:alert, no distress and comfortable SKIN: skin color, texture, turgor are normal, no rashes or significant lesions EYES: normal, Conjunctiva are pink and non-injected, sclera clear OROPHARYNX:no exudate, no erythema and lips, buccal mucosa, and tongue normal  NECK: supple, thyroid normal size, non-tender, without nodularity LYMPH:  no palpable lymphadenopathy in the cervical, axillary or inguinal LUNGS: clear to auscultation and percussion with normal breathing effort HEART: regular rate & rhythm and no murmurs and no lower extremity edema ABDOMEN:abdomen soft, non-tender and normal bowel sounds Musculoskeletal:no cyanosis of digits and no clubbing  NEURO: alert & oriented x 3 with fluent speech, no focal motor/sensory deficits Left breast: Healing well with her lumpectomy as well as a sentinel scar no masses nipple discharge right breast no masses or nipple discharge or skin change LABORATORY DATA:  I have reviewed the data as listed    Component Value Date/Time   NA 141 11/01/2013 1137   NA 138 08/04/2013 1628   K 3.8 11/01/2013 1137   K 3.7 08/04/2013 1628   CL 104 08/04/2013 1628   CO2 26 11/01/2013 1137   CO2 28 08/04/2013 1628   GLUCOSE 91 11/01/2013 1137   GLUCOSE 76 08/04/2013 1628   BUN 12.0 11/01/2013 1137   BUN 11 08/04/2013 1628   CREATININE 0.7 11/01/2013 1137   CREATININE 0.71 08/04/2013 1628   CREATININE 0.6 06/27/2012 1106   CALCIUM 9.9 11/01/2013 1137   CALCIUM 9.3 08/04/2013 1628   PROT 7.2 11/01/2013  1137   PROT 6.5 08/04/2013 1628   ALBUMIN 4.7 11/01/2013 1137   ALBUMIN 4.4 08/04/2013 1628   AST 14 11/01/2013 1137   AST 14 08/04/2013 1628   ALT 15 11/01/2013 1137   ALT 11 08/04/2013 1628   ALKPHOS 50 11/01/2013 1137   ALKPHOS 43 08/04/2013 1628   BILITOT 0.84 11/01/2013 1137   BILITOT 0.7 08/04/2013  1628    No results found for this basename: SPEP, UPEP,  kappa and lambda light chains    Lab Results  Component Value Date   WBC 7.4 11/01/2013   NEUTROABS 4.4 11/01/2013   HGB 14.8 11/22/2013   HCT 41.5 11/01/2013   MCV 92.9 11/01/2013   PLT 240 11/01/2013      Chemistry      Component Value Date/Time   NA 141 11/01/2013 1137   NA 138 08/04/2013 1628   K 3.8 11/01/2013 1137   K 3.7 08/04/2013 1628   CL 104 08/04/2013 1628   CO2 26 11/01/2013 1137   CO2 28 08/04/2013 1628   BUN 12.0 11/01/2013 1137   BUN 11 08/04/2013 1628   CREATININE 0.7 11/01/2013 1137   CREATININE 0.71 08/04/2013 1628   CREATININE 0.6 06/27/2012 1106      Component Value Date/Time   CALCIUM 9.9 11/01/2013 1137   CALCIUM 9.3 08/04/2013 1628   ALKPHOS 50 11/01/2013 1137   ALKPHOS 43 08/04/2013 1628   AST 14 11/01/2013 1137   AST 14 08/04/2013 1628   ALT 15 11/01/2013 1137   ALT 11 08/04/2013 1628   BILITOT 0.84 11/01/2013 1137   BILITOT 0.7 08/04/2013 1628       RADIOGRAPHIC STUDIES: I have personally reviewed the radiological images as listed and agreed with the findings in the report. No results found.    ASSESSMENT & PLAN:  53 year old female with  #1 stage I (T1 N0) grade 1 invasive ductal carcinoma found on a screening mammogram. Biopsy revealed the tumor to be ER positive PR positive HER-2/neu negative with a proliferation marker Ki-67 30%. MRI showed the tumor to be 1.9 cm. She was originally seen in the multidisciplinary breast clinic. She has had her lumpectomy with sentinel lymph node biopsy performed on 11/22/2013. The final pathology did reveal invasive ductal carcinoma grade 1. It measured 1.6 cm. One sentinel node was negative for metastatic disease. Tumor was confirmed to be ER PR positive HER-2/neu negative. With Ki-67 30%. Postoperatively she is doing well.  #2 Oncotype DX testing performed on the final specimen revealed the recurrence score at 17 giving her a 10% risk of distant recurrence with 5 years of  tamoxifen. We discussed this will result in detail. She does not need chemotherapy. She however will need antiestrogen therapy. We discussed the different treatment options including tamoxifen since potentially she is premenopausal. However certainly we could check her hormone levels and see if she is postmenopausal and she is then we could give her an aromatase inhibitor.  We also could suppress your ovaries with Zoladex and give her an aromatase inhibitor if she is premenopausal. All of this was discussed with the patient.  #3 patient will proceed with radiation therapy first. She was seen by Dr. Arloa Koh and her simulation is set up for April 6. I have recommended that she proceed with this. Once she has finished up radiation I will plan on seeing her back.  #4r smoking cessation: Patient and I discussed this again. She states that she is slowing down and eventually would like  to stop. I certainly will be able to get her to bee smoking cessation program. She was also given material on this.  #5 followup: Patient will be seen back in mid May in followup at which point we will plan on getting her started on antiestrogen therapy.  No orders of the defined types were placed in this encounter.   All questions were answered. The patient knows to call the clinic with any problems, questions or concerns. No barriers to learning was detected. I spent 20 minutes counseling the patient face to face. The total time spent in the appointment was 30 minutes and more than 50% was on counseling and review of test results     Marcy Panning, MD 12/21/2013 3:38 PM

## 2013-12-21 NOTE — Patient Instructions (Signed)

## 2013-12-21 NOTE — Telephone Encounter (Signed)
, °

## 2013-12-27 ENCOUNTER — Encounter: Payer: Self-pay | Admitting: Oncology

## 2013-12-27 NOTE — Progress Notes (Signed)
Patient said she faxed me proof of her hubby's income. I advised her have not recd she will email it to me.,

## 2013-12-29 ENCOUNTER — Telehealth: Payer: Self-pay | Admitting: *Deleted

## 2013-12-29 NOTE — Telephone Encounter (Signed)
Mailed after appt letter to pt. 

## 2014-01-01 ENCOUNTER — Ambulatory Visit
Admission: RE | Admit: 2014-01-01 | Discharge: 2014-01-01 | Disposition: A | Discharge status: Home / Self Care | Payer: BC Managed Care – PPO | Source: Ambulatory Visit | Attending: Radiation Oncology | Admitting: Radiation Oncology

## 2014-01-01 DIAGNOSIS — Z510 Encounter for antineoplastic radiation therapy: Secondary | ICD-10-CM | POA: Insufficient documentation

## 2014-01-01 DIAGNOSIS — C50212 Malignant neoplasm of upper-inner quadrant of left female breast: Secondary | ICD-10-CM

## 2014-01-01 DIAGNOSIS — C50219 Malignant neoplasm of upper-inner quadrant of unspecified female breast: Secondary | ICD-10-CM | POA: Insufficient documentation

## 2014-01-01 NOTE — Progress Notes (Signed)
Complex simulation/treatment planning note: The patient was taken to the CT simulator. A Vac lock immobilization device was constructed on a custom breast board. Her left breast field borders were marked with radiopaque wires in addition to her partial mastectomy scar. She was then scanned. The proposed tangential fields part of the cardiac silhouette and she was rescanned with deep inspiration and breath-hold allowing for significant movement of the cardiac silhouette. Critical structures including the lungs, heart were contoured in addition to her lumpectomy tumor bed. She is now ready for 3-D simulation. I prescribing 5000 cGy in 25 sessions. There will not be a boost. She'll undergo daily topical guidance of for respiratory management.

## 2014-01-04 ENCOUNTER — Encounter: Payer: Self-pay | Admitting: Radiation Oncology

## 2014-01-04 NOTE — Progress Notes (Signed)
  Radiation Oncology         [phone]) [phone] ________________________________  Name: Olivia Gross MRN: [id number]  Date: 01/04/2014  DOB: 1960/10/09  Optical Surface Tracking Plan:  Since intensity modulated radiotherapy (IMRT) and 3D conformal radiation treatment methods are predicated on accurate and precise positioning for treatment, intrafraction motion monitoring is medically necessary to ensure accurate and safe treatment delivery.  The ability to quantify intrafraction motion without excessive ionizing radiation dose can only be performed with optical surface tracking. Accordingly, surface imaging offers the opportunity to obtain 3D measurements of patient position throughout IMRT and 3D treatments without excessive radiation exposure.  I am ordering optical surface tracking for this patient's upcoming course of radiotherapy. ________________________________  Rexene Edison, MD 01/04/2014 8:20 PM    Reference:   Particia Jasper, et al. Surface imaging-based analysis of intrafraction motion for breast radiotherapy patients.Journal of [place], n. 6, nov. 2014. ISSN [id number].   Available at: <http://www.jacmp.org/index.php/jacmp/article/view/4957>.

## 2014-01-04 NOTE — Progress Notes (Signed)
3-D simulation note: The patient completed 3-D simulation for treatment to her left breast with deep inspiration/breath-hold. She is setup to 2 fields medially, and 2 fields laterally for 4 sets of complex treatment devices. Dose volume histograms were obtained for the lungs, and heart. We met our departmental guidelines. She will be treated with deep inspiration and breath-hold and undergo daily optical guidance.

## 2014-01-04 NOTE — Progress Notes (Addendum)
  Radiation Oncology         [phone]) [phone] ________________________________  Name: Olivia Gross MRN: [id number]  Date: 01/04/2014  DOB: 1961-09-19  RESPIRATORY MOTION MANAGEMENT SIMULATION  NARRATIVE:  In order to account for effect of respiratory motion on target structures and other organs in the planning and delivery of radiotherapy, this patient underwent respiratory motion management simulation.  To accomplish this, when the patient was brought to the CT simulation planning suite, 4D respiratory motion management CT images were obtained.  The CT images were loaded into the planning software.  Then, using a variety of tools including Cine, MIP, and standard views, the target volume and planning target volumes (PTV) were delineated.  Avoidance structures were contoured.  Treatment planning then occurred.  Dose volume histograms were generated and reviewed for each of the requested structure.  The resulting plan was carefully reviewed and approved today.

## 2014-01-08 ENCOUNTER — Ambulatory Visit
Admission: RE | Admit: 2014-01-08 | Discharge: 2014-01-08 | Disposition: A | Discharge status: Home / Self Care | Payer: BC Managed Care – PPO | Source: Ambulatory Visit | Attending: Radiation Oncology | Admitting: Radiation Oncology

## 2014-01-08 DIAGNOSIS — C50212 Malignant neoplasm of upper-inner quadrant of left female breast: Secondary | ICD-10-CM

## 2014-01-08 NOTE — Progress Notes (Signed)
Simulation verification note: The patient underwent similar to verification for treatment to her left breast. Her isocenter is in good position and the multileaf collimators contoured the treatment volume appropriately.

## 2014-01-09 ENCOUNTER — Ambulatory Visit
Admission: RE | Admit: 2014-01-09 | Discharge: 2014-01-09 | Disposition: A | Discharge status: Home / Self Care | Payer: BC Managed Care – PPO | Source: Ambulatory Visit | Attending: Radiation Oncology | Admitting: Radiation Oncology

## 2014-01-10 ENCOUNTER — Ambulatory Visit
Admission: RE | Admit: 2014-01-10 | Discharge: 2014-01-10 | Disposition: A | Discharge status: Home / Self Care | Payer: BC Managed Care – PPO | Source: Ambulatory Visit | Attending: Radiation Oncology | Admitting: Radiation Oncology

## 2014-01-11 ENCOUNTER — Ambulatory Visit
Admission: RE | Admit: 2014-01-11 | Discharge: 2014-01-11 | Disposition: A | Discharge status: Home / Self Care | Payer: BC Managed Care – PPO | Source: Ambulatory Visit | Attending: Radiation Oncology | Admitting: Radiation Oncology

## 2014-01-11 DIAGNOSIS — C50212 Malignant neoplasm of upper-inner quadrant of left female breast: Secondary | ICD-10-CM

## 2014-01-11 MED ORDER — ALRA NON-METALLIC DEODORANT (RAD-ONC)
1.0000 "application " | Freq: Once | TOPICAL | Status: AC
  Administered 2014-01-11: 1 via TOPICAL

## 2014-01-11 MED ORDER — RADIAPLEXRX EX GEL
Freq: Once | CUTANEOUS | Status: AC
  Administered 2014-01-11: 16:00:00 via TOPICAL

## 2014-01-11 NOTE — Progress Notes (Signed)
Post sim ed completed w/pt. Gave pt "Radiation and You" booklet w/all pertinent information marked and discussed, re: fatigue, skin irritation/care, nutrition, pain. Gave pt Radiaplex, Alra w/instructions for proper use. Pt verbalized understanding. 

## 2014-01-12 ENCOUNTER — Ambulatory Visit
Admission: RE | Admit: 2014-01-12 | Discharge: 2014-01-12 | Disposition: A | Discharge status: Home / Self Care | Payer: BC Managed Care – PPO | Source: Ambulatory Visit | Attending: Radiation Oncology | Admitting: Radiation Oncology

## 2014-01-15 ENCOUNTER — Encounter: Payer: Self-pay | Admitting: Radiation Oncology

## 2014-01-15 ENCOUNTER — Ambulatory Visit
Admission: RE | Admit: 2014-01-15 | Discharge: 2014-01-15 | Disposition: A | Discharge status: Home / Self Care | Payer: BC Managed Care – PPO | Source: Ambulatory Visit | Attending: Radiation Oncology | Admitting: Radiation Oncology

## 2014-01-15 VITALS — BP 146/94 | HR 72 | Temp 97.8°F | Resp 20 | Wt 161.0 lb

## 2014-01-15 DIAGNOSIS — C50212 Malignant neoplasm of upper-inner quadrant of left female breast: Secondary | ICD-10-CM

## 2014-01-15 NOTE — Progress Notes (Signed)
Pt states he continues to have "burning pain in her vein" on her left surgical side. She takes Tylenol prn w/fair relief.  Pt denies fatigue, loss of appetite.

## 2014-01-15 NOTE — Progress Notes (Signed)
Weekly Management Note:  Site: Left breast Current Dose:  1000  cGy Projected Dose: 5000  cGy, no boost  Narrative: The patient is seen today for routine under treatment assessment. CBCT/MVCT images/port films were reviewed. The chart was reviewed.   She does report discomfort along a "vein" along her left axilla. She uses Radioplex gel.  Physical Examination:  Filed Vitals:   01/15/14 1446  BP: 146/94  Pulse: 72  Temp: 97.8 F (36.6 C)  Resp: 20  .  Weight: 161 lb (73.029 kg). There no significant skin changes. There is what appears to be a small venous cord along her axilla which may represent a small area of phlebitis.  Impression: Tolerating radiation therapy well. She may take ibuprofen when necessary for suspected phlebitis.  Plan: Continue radiation therapy as planned.

## 2014-01-16 ENCOUNTER — Ambulatory Visit
Admission: RE | Admit: 2014-01-16 | Discharge: 2014-01-16 | Disposition: A | Discharge status: Home / Self Care | Payer: BC Managed Care – PPO | Source: Ambulatory Visit | Attending: Radiation Oncology | Admitting: Radiation Oncology

## 2014-01-17 ENCOUNTER — Ambulatory Visit
Admission: RE | Admit: 2014-01-17 | Discharge: 2014-01-17 | Disposition: A | Discharge status: Home / Self Care | Payer: BC Managed Care – PPO | Source: Ambulatory Visit | Attending: Radiation Oncology | Admitting: Radiation Oncology

## 2014-01-18 ENCOUNTER — Ambulatory Visit
Admission: RE | Admit: 2014-01-18 | Discharge: 2014-01-18 | Disposition: A | Discharge status: Home / Self Care | Payer: BC Managed Care – PPO | Source: Ambulatory Visit | Attending: Radiation Oncology | Admitting: Radiation Oncology

## 2014-01-19 ENCOUNTER — Ambulatory Visit
Admission: RE | Admit: 2014-01-19 | Discharge: 2014-01-19 | Disposition: A | Discharge status: Home / Self Care | Payer: BC Managed Care – PPO | Source: Ambulatory Visit | Attending: Radiation Oncology | Admitting: Radiation Oncology

## 2014-01-22 ENCOUNTER — Ambulatory Visit
Admission: RE | Admit: 2014-01-22 | Discharge: 2014-01-22 | Disposition: A | Discharge status: Home / Self Care | Payer: BC Managed Care – PPO | Source: Ambulatory Visit | Attending: Radiation Oncology | Admitting: Radiation Oncology

## 2014-01-22 VITALS — BP 133/85 | HR 90 | Temp 98.0°F | Resp 20 | Wt 162.2 lb

## 2014-01-22 DIAGNOSIS — C50212 Malignant neoplasm of upper-inner quadrant of left female breast: Secondary | ICD-10-CM

## 2014-01-22 NOTE — Progress Notes (Signed)
CC: Dr. Autumn Messing III  Weekly Management Note:  Site: Left breast Current Dose:  2000  cGy Projected Dose: 5000  cGy, no boost  Narrative: The patient is seen today for routine under treatment assessment. CBCT/MVCT images/port films were reviewed. The chart was reviewed.   She is complaining of more medial left upper extremity "burning sensation" where I felt that she had a superficial phlebitis. She states that discomfort does improve with ibuprofen, but the discomfort has extended further down the arm towards the elbow. She also reports "numbness" along her left hand over the past week. She has been working.  Physical Examination:  Filed Vitals:   01/22/14 1403  BP: 133/85  Pulse: 90  Temp: 98 F (36.7 C)  Resp: 20  .  Weight: 162 lb 3.2 oz (73.573 kg). There are no significant skin changes. There is an obvious cord extending from her axilla down to the distal biceps. This is consistent with phlebitis. There is subjective decreased sensation along the left hand. Strength is good. There is no lymphedema.  Impression: Tolerating radiation therapy well. I suspect that she does have phlebitis along her left upper extremity, but I am not clear as to why her left hand is numb. I wonder if her left arm position for treatment (extended) is affecting her left-hand sensation. I would like for her to see Dr. Marlou Starks this week.  Plan: Continue radiation therapy as planned. Hopefully, she can see Dr. Marlou Starks this week.

## 2014-01-22 NOTE — Progress Notes (Addendum)
Pt continues to have "burning pain" in her upper left underarm. She states that the pain is now radiating down her inner upper arm to elbow and her fingers of left hand are going numb. She takes Ibuprofen and has applied heat w/some relief. Pt has FU w/Dr Marlou Starks next month, enc to discuss with him. She will discuss w/Dr Valere Dross today. No warmth or redness of upper left arm noted. Pt denies fatigue, loss of appetite. She is applying Radiaplex to left breast treatment area.

## 2014-01-23 ENCOUNTER — Ambulatory Visit
Admission: RE | Admit: 2014-01-23 | Discharge: 2014-01-23 | Disposition: A | Discharge status: Home / Self Care | Payer: BC Managed Care – PPO | Source: Ambulatory Visit | Attending: Radiation Oncology | Admitting: Radiation Oncology

## 2014-01-24 ENCOUNTER — Ambulatory Visit
Admission: RE | Admit: 2014-01-24 | Discharge: 2014-01-24 | Disposition: A | Discharge status: Home / Self Care | Payer: BC Managed Care – PPO | Source: Ambulatory Visit | Attending: Radiation Oncology | Admitting: Radiation Oncology

## 2014-01-25 ENCOUNTER — Ambulatory Visit
Admission: RE | Admit: 2014-01-25 | Discharge: 2014-01-25 | Disposition: A | Discharge status: Home / Self Care | Payer: BC Managed Care – PPO | Source: Ambulatory Visit | Attending: Radiation Oncology | Admitting: Radiation Oncology

## 2014-01-26 ENCOUNTER — Ambulatory Visit
Admission: RE | Admit: 2014-01-26 | Discharge: 2014-01-26 | Disposition: A | Discharge status: Home / Self Care | Payer: BC Managed Care – PPO | Source: Ambulatory Visit | Attending: Radiation Oncology | Admitting: Radiation Oncology

## 2014-01-29 ENCOUNTER — Ambulatory Visit
Admission: RE | Admit: 2014-01-29 | Discharge: 2014-01-29 | Disposition: A | Discharge status: Home / Self Care | Payer: BC Managed Care – PPO | Source: Ambulatory Visit | Attending: Radiation Oncology | Admitting: Radiation Oncology

## 2014-01-29 ENCOUNTER — Encounter: Payer: Self-pay | Admitting: Radiation Oncology

## 2014-01-29 VITALS — BP 154/85 | HR 72 | Temp 98.9°F | Resp 20 | Wt 161.7 lb

## 2014-01-29 DIAGNOSIS — C50212 Malignant neoplasm of upper-inner quadrant of left female breast: Secondary | ICD-10-CM

## 2014-01-29 NOTE — Progress Notes (Signed)
Weekly Management Note:  Site: Left breast Current Dose:  3000  cGy Projected Dose: 5000  cGy  Narrative: The patient is seen today for routine under treatment assessment. CBCT/MVCT images/port films were reviewed. The chart was reviewed.   She is without complaints today. She uses Radioplex gel.  Physical Examination:  Filed Vitals:   01/29/14 1416  BP: 154/85  Pulse: 72  Temp: 98.9 F (37.2 C)  Resp: 20  .  Weight: 161 lb 11.2 oz (73.347 kg). There is mild erythema along her left inframammary region and lower axilla. No areas of desquamation.  Impression: Tolerating radiation therapy well.  Plan: Continue radiation therapy as planned.

## 2014-01-29 NOTE — Progress Notes (Signed)
Pt denies pain, fatigue, loss of appetite. She is applying Radiaplex to left breast treatment area; no skin changes at this point.

## 2014-01-30 ENCOUNTER — Ambulatory Visit
Admission: RE | Admit: 2014-01-30 | Discharge: 2014-01-30 | Disposition: A | Discharge status: Home / Self Care | Payer: BC Managed Care – PPO | Source: Ambulatory Visit | Attending: Radiation Oncology | Admitting: Radiation Oncology

## 2014-01-31 ENCOUNTER — Ambulatory Visit
Admission: RE | Admit: 2014-01-31 | Discharge: 2014-01-31 | Disposition: A | Discharge status: Home / Self Care | Payer: BC Managed Care – PPO | Source: Ambulatory Visit | Attending: Radiation Oncology | Admitting: Radiation Oncology

## 2014-02-01 ENCOUNTER — Ambulatory Visit
Admission: RE | Admit: 2014-02-01 | Discharge: 2014-02-01 | Disposition: A | Discharge status: Home / Self Care | Payer: BC Managed Care – PPO | Source: Ambulatory Visit | Attending: Radiation Oncology | Admitting: Radiation Oncology

## 2014-02-02 ENCOUNTER — Ambulatory Visit
Admission: RE | Admit: 2014-02-02 | Discharge: 2014-02-02 | Disposition: A | Discharge status: Home / Self Care | Payer: BC Managed Care – PPO | Source: Ambulatory Visit | Attending: Radiation Oncology | Admitting: Radiation Oncology

## 2014-02-05 ENCOUNTER — Ambulatory Visit
Admission: RE | Admit: 2014-02-05 | Discharge: 2014-02-05 | Disposition: A | Discharge status: Home / Self Care | Payer: BC Managed Care – PPO | Source: Ambulatory Visit | Attending: Radiation Oncology | Admitting: Radiation Oncology

## 2014-02-05 ENCOUNTER — Encounter: Payer: Self-pay | Admitting: Radiation Oncology

## 2014-02-05 VITALS — BP 131/84 | HR 80 | Temp 98.4°F | Resp 20 | Wt 161.7 lb

## 2014-02-05 DIAGNOSIS — C50212 Malignant neoplasm of upper-inner quadrant of left female breast: Secondary | ICD-10-CM

## 2014-02-05 NOTE — Progress Notes (Signed)
Pt states her left nipple is "sore". She states last week she noticed a "rash all over her left breast"; there is faint sparse follicular irritation over left breast. Pt denies itchiness. She is fatigued, but denies loss of appetite. She is applying Radiaplex to left breast; advised she notify nursing if she develops itching.

## 2014-02-05 NOTE — Progress Notes (Signed)
Weekly Management Note:  Site: Left breast Current Dose:  4000  cGy Projected Dose: 5000  CGy, no boost  Narrative: The patient is seen today for routine under treatment assessment. CBCT/MVCT images/port films were reviewed. The chart was reviewed.   She does report mild left breast discomfort, particularly along the nipple. She uses Radioplex gel.  Physical Examination:  Filed Vitals:   02/05/14 1346  BP: 131/84  Pulse: 80  Temp: 98.4 F (36.9 C)  Resp: 20  .  Weight: 161 lb 11.2 oz (73.347 kg). There is mild erythema along hyperpigmentation the skin along the left breast. No areas of desquamation.  Impression: Tolerating radiation therapy well.  Plan: Continue radiation therapy as planned.

## 2014-02-06 ENCOUNTER — Ambulatory Visit
Admission: RE | Admit: 2014-02-06 | Discharge: 2014-02-06 | Disposition: A | Discharge status: Home / Self Care | Payer: BC Managed Care – PPO | Source: Ambulatory Visit | Attending: Radiation Oncology | Admitting: Radiation Oncology

## 2014-02-07 ENCOUNTER — Ambulatory Visit
Admission: RE | Admit: 2014-02-07 | Discharge: 2014-02-07 | Disposition: A | Discharge status: Home / Self Care | Payer: BC Managed Care – PPO | Source: Ambulatory Visit | Attending: Radiation Oncology | Admitting: Radiation Oncology

## 2014-02-07 ENCOUNTER — Encounter (INDEPENDENT_AMBULATORY_CARE_PROVIDER_SITE_OTHER): Payer: Self-pay | Admitting: General Surgery

## 2014-02-07 ENCOUNTER — Ambulatory Visit (INDEPENDENT_AMBULATORY_CARE_PROVIDER_SITE_OTHER): Payer: BC Managed Care – PPO | Admitting: General Surgery

## 2014-02-07 VITALS — BP 124/82 | HR 66 | Ht 65.0 in | Wt 158.8 lb

## 2014-02-07 DIAGNOSIS — C50212 Malignant neoplasm of upper-inner quadrant of left female breast: Secondary | ICD-10-CM

## 2014-02-07 DIAGNOSIS — C50219 Malignant neoplasm of upper-inner quadrant of unspecified female breast: Secondary | ICD-10-CM

## 2014-02-07 NOTE — Patient Instructions (Signed)
Continue regular self exams Start antiestrogens once radiation is done

## 2014-02-07 NOTE — Progress Notes (Signed)
Subjective:     Patient ID: Olivia Gross, female   DOB: [birth year], 53 y.o.   MRN: [id number]  HPI The patient is a 53 year old white female who is 3 months status post left breast lumpectomy and negative sentinel node biopsy for a T1 C. N0 left breast cancer. She was ER and PR positive and HER-2/neu negative. She will finish her radiation next Monday. She still complains of some pain in the back of her left arm but this is a little bit improved from her last visit.  Review of Systems  Constitutional: Negative.   HENT: Negative.   Eyes: Negative.   Respiratory: Negative.   Cardiovascular: Negative.   Gastrointestinal: Negative.   Endocrine: Negative.   Genitourinary: Negative.   Musculoskeletal: Negative.   Skin: Negative.   Allergic/Immunologic: Negative.   Neurological: Negative.   Hematological: Negative.   Psychiatric/Behavioral: Negative.        Objective:   Physical Exam  Constitutional: She is oriented to person, place, and time. She appears well-developed and well-nourished.  HENT:  Head: Normocephalic and atraumatic.  Eyes: Conjunctivae and EOM are normal. Pupils are equal, round, and reactive to light.  Neck: Normal range of motion. Neck supple.  Cardiovascular: Normal rate, regular rhythm and normal heart sounds.   Pulmonary/Chest: Effort normal and breath sounds normal.  Her left breast has tolerated radiation well. Her incisions are healing nicely with no sign of infection or significant seroma. There is no palpable mass in either breast. There is no palpable axillary, supraclavicular, or cervical lymphadenopathy. There are 4 palpable nodules around the area all of that correspond to her nuclear medicine injection which are probably palpable now secondary to the radiation therapy  Abdominal: Soft. Bowel sounds are normal.  Musculoskeletal: Normal range of motion.  Lymphadenopathy:    She has no cervical adenopathy.  Neurological: She is alert and oriented to person,  place, and time.  Skin: Skin is warm and dry.  Psychiatric: She has a normal mood and affect. Her behavior is normal.       Assessment:     The patient is 3 months status post left breast lumpectomy for breast cancer     Plan:     At this point she will complete her radiation therapy. She will probably start on antiestrogen therapy next week. I will plan to see her back in 3 months. She will continue to do regular self exams.

## 2014-02-08 ENCOUNTER — Ambulatory Visit
Admission: RE | Admit: 2014-02-08 | Discharge: 2014-02-08 | Disposition: A | Discharge status: Home / Self Care | Payer: BC Managed Care – PPO | Source: Ambulatory Visit | Attending: Radiation Oncology | Admitting: Radiation Oncology

## 2014-02-09 ENCOUNTER — Ambulatory Visit
Admission: RE | Admit: 2014-02-09 | Discharge: 2014-02-09 | Disposition: A | Discharge status: Home / Self Care | Payer: BC Managed Care – PPO | Source: Ambulatory Visit | Attending: Radiation Oncology | Admitting: Radiation Oncology

## 2014-02-12 ENCOUNTER — Ambulatory Visit
Admission: RE | Admit: 2014-02-12 | Discharge: 2014-02-12 | Disposition: A | Discharge status: Home / Self Care | Payer: BC Managed Care – PPO | Source: Ambulatory Visit | Attending: Radiation Oncology | Admitting: Radiation Oncology

## 2014-02-12 VITALS — BP 142/78 | HR 74 | Temp 98.6°F | Wt 161.5 lb

## 2014-02-12 DIAGNOSIS — C50212 Malignant neoplasm of upper-inner quadrant of left female breast: Secondary | ICD-10-CM

## 2014-02-12 MED ORDER — RADIAPLEXRX EX GEL
Freq: Once | CUTANEOUS | Status: AC
  Administered 2014-02-12: 14:00:00 via TOPICAL

## 2014-02-12 NOTE — Progress Notes (Signed)
Weekly Management Note:  Site: Left Breast Current Dose:  5000  cGy Projected Dose: 5000  cGy  Narrative: The patient is seen today for routine under treatment assessment. CBCT/MVCT images/port films were reviewed. The chart was reviewed.   She completes her radiation therapy today. She uses Radioplex gel. She does have pruritus along her axilla and inframammary region for which she uses hydrocortisone cream.  Physical Examination:  Filed Vitals:   02/12/14 1408  BP: 142/78  Pulse: 74  Temp: 98.6 F (37 C)  .  Weight: 161 lb 8 oz (73.256 kg). There is mild to moderate erythema/hyperpigmentation the skin along the left breast. There is dry desquamation along the axilla and inframammary region.  Impression: Radiation therapy well tolerated.  Plan: Followup visit in one month.

## 2014-02-12 NOTE — Progress Notes (Signed)
Patient for weekly assessment of radiation to left breast.Completes 25 treatments.No boost.Given another tube of radiaplex to apply.Has one month appointment card to schedule follow up in one month.Understands ok to apply hydrocortisone to are of itching.

## 2014-02-13 ENCOUNTER — Encounter: Payer: Self-pay | Admitting: Radiation Oncology

## 2014-02-13 NOTE — Progress Notes (Signed)
[facility] End of Treatment Note  Name:Olivia Gross  Date: 02/13/2014 ZHG:[id number] DOB:12-10-1960   Status:outpatient    CC: [name], MD  Dr. Autumn Messing III  REFERRING PHYSICIAN:   Dr. 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  She used Radioplex gel during her course of treatment.                        PLAN: Routine followup in one month. Patient instructed to call if questions or worsening complaints in interim.

## 2014-02-14 ENCOUNTER — Ambulatory Visit (HOSPITAL_BASED_OUTPATIENT_CLINIC_OR_DEPARTMENT_OTHER): Payer: BC Managed Care – PPO | Admitting: Adult Health

## 2014-02-14 ENCOUNTER — Encounter: Payer: Self-pay | Admitting: Adult Health

## 2014-02-14 VITALS — BP 168/93 | HR 71 | Temp 98.4°F | Resp 18 | Ht 65.0 in | Wt 160.0 lb

## 2014-02-14 DIAGNOSIS — C50219 Malignant neoplasm of upper-inner quadrant of unspecified female breast: Secondary | ICD-10-CM

## 2014-02-14 DIAGNOSIS — Z170 Estrogen receptor positive status [ER+]: Secondary | ICD-10-CM

## 2014-02-14 DIAGNOSIS — C50212 Malignant neoplasm of upper-inner quadrant of left female breast: Secondary | ICD-10-CM

## 2014-02-14 MED ORDER — TAMOXIFEN CITRATE 20 MG PO TABS: 20.0000 mg | ORAL_TABLET | Freq: Every day | ORAL | Status: DC

## 2014-02-14 NOTE — Progress Notes (Signed)
ID: Olivia Gross OB: 08-Mar-1961  MR#: [id number]  DXI#:[id number]  PCP: [name], MD GYN:   SU: Dr. Autumn Messing OTHER MD:  Dr. Janey Greaser oncology  CHIEF COMPLAINT: 53 y/o woman with left breast cancer here for evaluation following adjuvant radiation therapy.    BREAST CANCER HISTORY: 1.  Patient underwent screening mammogram performed on 09/06/2013 that revealed a possible mass in the left breast. In 10/12/2013 she had spot compression views performed that was consistent with a true mass lying medially near the 9:00 position measuring 7-8 mm in size. Ultrasound showed a lobulated hypoechoic mass in the 10:00 position 10 cm from the nipple measuring 8 mm x 6 mm x 6.6 mm. 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 Patient had MRI of the breasts performed on 10/30/2013 in the right breast there were no masses or abnormal enhancement. In the left breast within the upper-outer quadrant there was an enhancing mass associated with clip artifact measuring 1.3 x 1.1 x 1.9 cm. No abnormal appearing lymph nodes were identified.   INTERVAL HISTORY:  Patient is here for evaluation of her h/o left breast cancer.  She recently completed radiation therapy with Dr. Valere Dross and tolerated it relatively well.  She does have some skin breakdown and redness from the radiation that is uncomfortable.  Otherwise, she denies fevers, chills, nausea, vomiting, constipation, diarrhea, dysuria, pain, weight loss, or any further concerns.   REVIEW OF SYSTEMS: A 10 point review of systems was conducted and is otherwise negative except for what is noted above.     PAST MEDICAL HISTORY: Past Medical History  Diagnosis Date  . Chicken pox   . Cervical cancer   . Wears dentures     full top-partial bottom  . Breast cancer 10/25/13    left, 9:00  o'clock    PAST SURGICAL HISTORY: Past Surgical History  Procedure Laterality Date  . Cervical cone biopsy  1986  . Colonoscopy    . Partial mastectomy with needle localization and axillary sentinel lymph node bx Left 11/22/2013    Procedure: PARTIAL MASTECTOMY WITH NEEDLE LOCALIZATION AND AXILLARY SENTINEL LYMPH NODE BIOPSY;  Surgeon: Merrie Roof, MD;  Location: [facility];  Service: General;  Laterality: Left;  . Foreign body removal Left 11/22/2013    Procedure: removal of implanon from left upper arm ;  Surgeon: Merrie Roof, MD;  Location: [facility];  Service: General;  Laterality: Left;    FAMILY HISTORY Family History  Problem Relation Age of Onset  . Arthritis Mother   . COPD Mother   . Cancer Mother     cervical  . Mental illness Maternal Aunt   . Heart disease Paternal Uncle   . Diabetes Maternal Grandmother   . Diabetes Maternal Grandfather   . Diabetes Sister   . Hypertension Sister     GYNECOLOGIC HISTORY: menarche at age 53, g83 p2, birth control use for 27 years, did have Norplant too which was removed at the time of her surgery.  Had no complications with this.  30 years ago patient had abnormal pap smear that required colposcopy with conization.  Everything was normal and she has not had another abnormal pap smear since.  No h/o sexually transmitted infections.  LMP 8 years ago.    SOCIAL HISTORY: Lives with husband of 1  year Richard in an apartment on the first floor, in [place], [place].  Patient has 2 children ages 64 and 10.  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     ADVANCED DIRECTIVES: Not in place   HEALTH MAINTENANCE: History  Substance Use Topics  . Smoking status: Current Every Day Smoker -- 1.00 packs/day for 20 years    Types: Cigarettes  . Smokeless tobacco: Not on file  . Alcohol Use: Yes     Comment: occaisonally      Mammogram:  09/06/2013 Colonoscopy: 09/2013, 10 year f/u recommended Bone Density  Scan: n/a Pap Smear: 2013, 3 year f/u recommended Eye Exam:  2012  Vitamin D Level:  08/04/2013 Lipid Panel: 08/04/2013   No Known Allergies  Current Outpatient Prescriptions  Medication Sig Dispense Refill  . hyaluronate sodium (RADIAPLEXRX) GEL Apply 1 application topically 2 (two) times daily.      . Multiple Vitamin (MULTIVITAMIN) tablet Take 1 tablet by mouth daily.      . naproxen sodium (ANAPROX) 220 MG tablet Take 220 mg by mouth daily as needed. Take for sinuses      . psyllium (METAMUCIL) 58.6 % powder Take 1 packet by mouth daily.      . tamoxifen (NOLVADEX) 20 MG tablet Take 1 tablet (20 mg total) by mouth daily.  30 tablet  6   No current facility-administered medications for this visit.    OBJECTIVE: Filed Vitals:   02/14/14 0925  BP: 168/93  Pulse:   Temp:   Resp:      Body mass index is 26.63 kg/(m^2).     GENERAL: Patient is a well appearing female in no acute distress HEENT:  Sclerae anicteric.  Oropharynx clear and moist. No ulcerations or evidence of oropharyngeal candidiasis. Neck is supple.  NODES:  No cervical, supraclavicular, or axillary lymphadenopathy palpated.  BREAST EXAM:  Right breast with no masses or nodules, left breast with erythema, lumpectomy site well healed, no noudlarity LUNGS:  Clear to auscultation bilaterally.  No wheezes or rhonchi. HEART:  Regular rate and rhythm. No murmur appreciated. ABDOMEN:  Soft, nontender.  Positive, normoactive bowel sounds. No organomegaly palpated. MSK:  No focal spinal tenderness to palpation. Full range of motion bilaterally in the upper extremities. EXTREMITIES:  No peripheral edema.   SKIN:  Clear with no obvious rashes or skin changes. No nail dyscrasia. NEURO:  Nonfocal. Well oriented.  Appropriate affect. ECOG FS:1 - Symptomatic but completely ambulatory  LAB RESULTS:  CMP     Component Value Date/Time   NA 141 11/01/2013 1137   NA 138 08/04/2013 1628   K 3.8 11/01/2013 1137   K 3.7 08/04/2013 1628    CL 104 08/04/2013 1628   CO2 26 11/01/2013 1137   CO2 28 08/04/2013 1628   GLUCOSE 91 11/01/2013 1137   GLUCOSE 76 08/04/2013 1628   BUN 12.0 11/01/2013 1137   BUN 11 08/04/2013 1628   CREATININE 0.7 11/01/2013 1137   CREATININE 0.71 08/04/2013 1628   CREATININE 0.6 06/27/2012 1106   CALCIUM 9.9 11/01/2013 1137   CALCIUM 9.3 08/04/2013 1628   PROT 7.2 11/01/2013 1137   PROT 6.5 08/04/2013 1628   ALBUMIN 4.7 11/01/2013 1137   ALBUMIN 4.4 08/04/2013 1628   AST 14 11/01/2013 1137   AST 14 08/04/2013 1628   ALT 15 11/01/2013 1137   ALT 11 08/04/2013 1628   ALKPHOS 50 11/01/2013 1137   ALKPHOS 43 08/04/2013 1628   BILITOT 0.84 11/01/2013 1137  BILITOT 0.7 08/04/2013 1628    I No results found for this basename: SPEP,  UPEP,   kappa and lambda light chains    Lab Results  Component Value Date   WBC 7.4 11/01/2013   NEUTROABS 4.4 11/01/2013   HGB 14.8 11/22/2013   HCT 41.5 11/01/2013   MCV 92.9 11/01/2013   PLT 240 11/01/2013      Chemistry      Component Value Date/Time   NA 141 11/01/2013 1137   NA 138 08/04/2013 1628   K 3.8 11/01/2013 1137   K 3.7 08/04/2013 1628   CL 104 08/04/2013 1628   CO2 26 11/01/2013 1137   CO2 28 08/04/2013 1628   BUN 12.0 11/01/2013 1137   BUN 11 08/04/2013 1628   CREATININE 0.7 11/01/2013 1137   CREATININE 0.71 08/04/2013 1628   CREATININE 0.6 06/27/2012 1106      Component Value Date/Time   CALCIUM 9.9 11/01/2013 1137   CALCIUM 9.3 08/04/2013 1628   ALKPHOS 50 11/01/2013 1137   ALKPHOS 43 08/04/2013 1628   AST 14 11/01/2013 1137   AST 14 08/04/2013 1628   ALT 15 11/01/2013 1137   ALT 11 08/04/2013 1628   BILITOT 0.84 11/01/2013 1137   BILITOT 0.7 08/04/2013 1628       No results found for this basename: LABCA2    No components found with this basename: LABCA125    No results found for this basename: INR,  in the last 168 hours  Urinalysis No results found for this basename: colorurine,  appearanceur,  labspec,  phurine,  glucoseu,  hgbur,  bilirubinur,  ketonesur,  proteinur,   urobilinogen,  nitrite,  leukocytesur    STUDIES: No results found.  ASSESSMENT: 53 y.o. [place], [place] woman with T1, N0, stage IA, invasive ductal carcinoma, grade II, ER 89%, PR 89%, Ki-67 25%, HER-2/neu negative.   1.  status post left lumpectomy with sentinel lymph node biopsy performed 11/22/2013 by Dr. Marlou Starks. The final pathology revealed a 1.6 cm grade II, invasive ductal carcinoma.  Margins were negative, and there was no lymphovascular invasion.    2. Oncotype DX testing performed on the tumor revealed: The patient's recurrence score was 17. Those patients who had a recurrence score of 17 had an average rate of distant  recurrence of 11%.   This is considered low risk for distant recurrence.    3. The patient underwent adjuvant radiation therapy from 01/09/14 through 02/12/14.  4. Patient will start anti-estrogen therapy in May, 2015 with Tamoxifen daily for two years and then we will re-evaluate the patient and consider starting an aromatase inhibitor.  PLAN:   Olivia Gross is doing well following radiation therapy.  She completed her adjuvant radiation therapy and tolerated it relatively well.  She will continue her supportive care to her left breast erythema due to her recent radiation.   I discussed her oncotype score again with her today and the need to start adjuvant anti-estrogen therapy.  We discussed Tamoxifen and I reviewed it with her in detail.  I also gave her detailed information about Tamoxifen in her AVS to review.  The working plan is to have her take Tamoxifen 90m daily for 2 years and then re-evaluate and possibly switch her to an aromatase inhibitor.  The patient verbalized understanding of this plan, and I prescribed Tamoxifen 275mdaily.    I reviewed her health maintenance with her today and encouraged healthy diet, exercise, and self breast exams.    The  patient will return in 3 months for labs and an office visit to evaluate how she is tolerating the Tamoxifen.   She  knows to call us in the interim for any questions or concerns.  We can certainly see her sooner if needed.  I spent 25 minutes counseling the patient face to face.  The total time spent in the appointment was 30 minutes.  Minette Headland, [place] [phone] 02/16/2014 8:59 AM

## 2014-02-14 NOTE — Patient Instructions (Signed)
You are doing well.  You will start Tamoxifen 20mg  daily and take for 2 years and we will then re-evaluate.  Please contact us if you have any difficulty with your medications, or if you have any further questions or concerns.  We will see you in 3 months for follow up.       Breast Self-Awareness Practicing breast self-awareness may pick up problems early, prevent significant medical complications, and possibly save your life. By practicing breast self-awareness, you can become familiar with how your breasts look and feel and if your breasts are changing. This allows you to notice changes early. It can also offer you some reassurance that your breast health is good. One way to learn what is normal for your breasts and whether your breasts are changing is to do a breast self-exam. If you find a lump or something that was not present in the past, it is best to contact your caregiver right away. Other findings that should be evaluated by your caregiver include nipple discharge, especially if it is bloody; skin changes or reddening; areas where the skin seems to be pulled in (retracted); or new lumps and bumps. Breast pain is seldom associated with cancer (malignancy), but should also be evaluated by a caregiver. HOW TO PERFORM A BREAST SELF-EXAM The best time to examine your breasts is 5 7 days after your menstrual period is over. During menstruation, the breasts are lumpier, and it may be more difficult to pick up changes. If you do not menstruate, have reached menopause, or had your uterus removed (hysterectomy), you should examine your breasts at regular intervals, such as monthly. If you are breastfeeding, examine your breasts after a feeding or after using a breast pump. Breast implants do not decrease the risk for lumps or tumors, so continue to perform breast self-exams as recommended. Talk to your caregiver about how to determine the difference between the implant and breast tissue. Also, talk about  the amount of pressure you should use during the exam. Over time, you will become more familiar with the variations of your breasts and more comfortable with the exam. A breast self-exam requires you to remove all your clothes above the waist. 1. Look at your breasts and nipples. Stand in front of a mirror in a room with good lighting. With your hands on your hips, push your hands firmly downward. Look for a difference in shape, contour, and size from one breast to the other (asymmetry). Asymmetry includes puckers, dips, or bumps. Also, look for skin changes, such as reddened or scaly areas on the breasts. Look for nipple changes, such as discharge, dimpling, repositioning, or redness. 2. Carefully feel your breasts. This is best done either in the shower or tub while using soapy water or when flat on your back. Place the arm (on the side of the breast you are examining) above your head. Use the pads (not the fingertips) of your three middle fingers on your opposite hand to feel your breasts. Start in the underarm area and use  inch (2 cm) overlapping circles to feel your breast. Use 3 different levels of pressure (light, medium, and firm pressure) at each circle before moving to the next circle. The light pressure is needed to feel the tissue closest to the skin. The medium pressure will help to feel breast tissue a little deeper, while the firm pressure is needed to feel the tissue close to the ribs. Continue the overlapping circles, moving downward over the breast until you  feel your ribs below your breast. Then, move one finger-width towards the center of the body. Continue to use the  inch (2 cm) overlapping circles to feel your breast as you move slowly up toward the collar bone (clavicle) near the base of the neck. Continue the up and down exam using all 3 pressures until you reach the middle of the chest. Do this with each breast, carefully feeling for lumps or changes. 3.  Keep a written record with  breast changes or normal findings for each breast. By writing this information down, you do not need to depend only on memory for size, tenderness, or location. Write down where you are in your menstrual cycle, if you are still menstruating. Breast tissue can have some lumps or thick tissue. However, see your caregiver if you find anything that concerns you.  SEEK MEDICAL CARE IF:  You see a change in shape, contour, or size of your breasts or nipples.   You see skin changes, such as reddened or scaly areas on the breasts or nipples.   You have an unusual discharge from your nipples.   You feel a new lump or unusually thick areas.  Document Released: 09/14/2005 Document Revised: 08/31/2012 Document Reviewed: 12/30/2011 [facility] Patient Information 2014 [place]. Tamoxifen oral tablet What is this medicine? TAMOXIFEN (ta MOX i fen) blocks the effects of estrogen. It is commonly used to treat breast cancer. It is also used to decrease the chance of breast cancer coming back in women who have received treatment for the disease. It may also help prevent breast cancer in women who have a high risk of developing breast cancer. This medicine may be used for other purposes; ask your health care provider or pharmacist if you have questions. COMMON BRAND NAME(S): Nolvadex What should I tell my health care provider before I take this medicine? They need to know if you have any of these conditions: -blood clots -blood disease -cataracts or impaired eyesight -endometriosis -high calcium levels -high cholesterol -irregular menstrual cycles -liver disease -stroke -uterine fibroids -an unusual or allergic reaction to tamoxifen, other medicines, foods, dyes, or preservatives -pregnant or trying to get pregnant -breast-feeding How should I use this medicine? Take this medicine by mouth with a glass of water. Follow the directions on the prescription label. You can take it with or without  food. Take your medicine at regular intervals. Do not take your medicine more often than directed. Do not stop taking except on your doctor's advice. A special MedGuide will be given to you by the pharmacist with each prescription and refill. Be sure to read this information carefully each time. Talk to your pediatrician regarding the use of this medicine in children. While this drug may be prescribed for selected conditions, precautions do apply. Overdosage: If you think you have taken too much of this medicine contact a poison control center or emergency room at once. NOTE: This medicine is only for you. Do not share this medicine with others. What if I miss a dose? If you miss a dose, take it as soon as you can. If it is almost time for your next dose, take only that dose. Do not take double or extra doses. What may interact with this medicine? -aminoglutethimide -bromocriptine -chemotherapy drugs -female hormones, like estrogens and birth control pills -letrozole -medroxyprogesterone -phenobarbital -rifampin -warfarin This list may not describe all possible interactions. Give your health care provider a list of all the medicines, herbs, non-prescription drugs, or dietary supplements you  use. Also tell them if you smoke, drink alcohol, or use illegal drugs. Some items may interact with your medicine. What should I watch for while using this medicine? Visit your doctor or health care professional for regular checks on your progress. You will need regular pelvic exams, breast exams, and mammograms. If you are taking this medicine to reduce your risk of getting breast cancer, you should know that this medicine does not prevent all types of breast cancer. If breast cancer or other problems occur, there is no guarantee that it will be found at an early stage. Do not become pregnant while taking this medicine or for 2 months after stopping this medicine. Stop taking this medicine if you get pregnant  or think you are pregnant and contact your doctor. This medicine may harm your unborn baby. Women who can possibly become pregnant should use birth control methods that do not use hormones during tamoxifen treatment and for 2 months after therapy has stopped. Talk with your health care provider for birth control advice. Do not breast feed while taking this medicine. What side effects may I notice from receiving this medicine? Side effects that you should report to your doctor or health care professional as soon as possible: -changes in vision (blurred vision) -changes in your menstrual cycle -difficulty breathing or shortness of breath -difficulty walking or talking -new breast lumps -numbness -pelvic pain or pressure -redness, blistering, peeling or loosening of the skin, including inside the mouth -skin rash or itching (hives) -sudden chest pain -swelling of lips, face, or tongue -swelling, pain or tenderness in your calf or leg -unusual bruising or bleeding -vaginal discharge that is bloody, brown, or rust -weakness -yellowing of the whites of the eyes or skin Side effects that usually do not require medical attention (report to your doctor or health care professional if they continue or are bothersome): -fatigue -hair loss, although uncommon and is usually mild -headache -hot flashes -impotence (in men) -nausea, vomiting (mild) -vaginal discharge (white or clear) This list may not describe all possible side effects. Call your doctor for medical advice about side effects. You may report side effects to FDA at 1-800-FDA-1088. Where should I keep my medicine? Keep out of the reach of children. Store at room temperature between 20 and 25 degrees C (68 and 77 degrees F). Protect from light. Keep container tightly closed. Throw away any unused medicine after the expiration date. NOTE: This sheet is a summary. It may not cover all possible information. If you have questions about this  medicine, talk to your doctor, pharmacist, or health care provider.  2014, [employer]. (2008-05-31 12:01:56)

## 2014-02-16 ENCOUNTER — Telehealth: Payer: Self-pay | Admitting: Oncology

## 2014-02-16 NOTE — Telephone Encounter (Signed)
, °

## 2014-03-20 ENCOUNTER — Encounter: Payer: Self-pay | Admitting: *Deleted

## 2014-03-22 ENCOUNTER — Encounter: Payer: Self-pay | Admitting: Radiation Oncology

## 2014-03-22 ENCOUNTER — Ambulatory Visit
Admission: RE | Admit: 2014-03-22 | Discharge: 2014-03-22 | Disposition: A | Discharge status: Home / Self Care | Payer: BC Managed Care – PPO | Source: Ambulatory Visit | Attending: Radiation Oncology | Admitting: Radiation Oncology

## 2014-03-22 VITALS — BP 152/83 | HR 87 | Temp 98.3°F | Resp 20 | Wt 159.0 lb

## 2014-03-22 DIAGNOSIS — C50212 Malignant neoplasm of upper-inner quadrant of left female breast: Secondary | ICD-10-CM

## 2014-03-22 NOTE — Progress Notes (Signed)
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 She is without complaints today. She has been on adjuvant tamoxifen since the end of her radiation therapy.  Physical examination: Alert and oriented. Filed Vitals:   03/22/14 1334  BP: 152/83  Pulse: 87  Temp: 98.3 F (36.8 C)  Resp: 20   Nodes: There is no palpable cervical, supraclavicular, or axillary lymphadenopathy. Breasts: There is minimal thickening of the left breast. No dominant masses are appreciated. Right breast without masses or lesions. Extremities: Without edema.  Impression: Satisfactory progress. She can resume mammography this January with a baseline left breast mammogram and a screening right breast mammogram. She will visit medical oncology on August 20.  Plan: As above. Of note is that she is thinking of moving to [place], and her records can be transferred when she makes definite plans.

## 2014-03-22 NOTE — Progress Notes (Signed)
Patient denies pain, fatigue, loss of appetite. She states her skin of left breast is healed.

## 2014-05-01 ENCOUNTER — Encounter: Payer: Self-pay | Admitting: *Deleted

## 2014-05-10 ENCOUNTER — Other Ambulatory Visit (HOSPITAL_BASED_OUTPATIENT_CLINIC_OR_DEPARTMENT_OTHER): Payer: BC Managed Care – PPO

## 2014-05-10 DIAGNOSIS — C50219 Malignant neoplasm of upper-inner quadrant of unspecified female breast: Secondary | ICD-10-CM

## 2014-05-10 DIAGNOSIS — C50212 Malignant neoplasm of upper-inner quadrant of left female breast: Secondary | ICD-10-CM

## 2014-05-10 LAB — CBC WITH DIFFERENTIAL/PLATELET
BASO%: 0.4 % (ref 0.0–2.0)
Basophils Absolute: 0 10*3/uL (ref 0.0–0.1)
EOS%: 2.8 % (ref 0.0–7.0)
Eosinophils Absolute: 0.2 10*3/uL (ref 0.0–0.5)
HCT: 38.6 % (ref 34.8–46.6)
HGB: 12.7 g/dL (ref 11.6–15.9)
LYMPH#: 1.6 10*3/uL (ref 0.9–3.3)
LYMPH%: 26.1 % (ref 14.0–49.7)
MCH: 30.8 pg (ref 25.1–34.0)
MCHC: 32.9 g/dL (ref 31.5–36.0)
MCV: 93.5 fL (ref 79.5–101.0)
MONO#: 0.5 10*3/uL (ref 0.1–0.9)
MONO%: 7.5 % (ref 0.0–14.0)
NEUT#: 3.9 10*3/uL (ref 1.5–6.5)
NEUT%: 63.2 % (ref 38.4–76.8)
Platelets: 222 10*3/uL (ref 145–400)
RBC: 4.13 10*6/uL (ref 3.70–5.45)
RDW: 13 % (ref 11.2–14.5)
WBC: 6.3 10*3/uL (ref 3.9–10.3)

## 2014-05-10 LAB — COMPREHENSIVE METABOLIC PANEL (CC13)
ALT: 10 U/L (ref 0–55)
AST: 15 U/L (ref 5–34)
Albumin: 3.8 g/dL (ref 3.5–5.0)
Alkaline Phosphatase: 44 U/L (ref 40–150)
Anion Gap: 9 mEq/L (ref 3–11)
BUN: 10.5 mg/dL (ref 7.0–26.0)
CALCIUM: 9.1 mg/dL (ref 8.4–10.4)
CHLORIDE: 106 meq/L (ref 98–109)
CO2: 26 mEq/L (ref 22–29)
Creatinine: 0.7 mg/dL (ref 0.6–1.1)
Glucose: 88 mg/dl (ref 70–140)
Potassium: 4 mEq/L (ref 3.5–5.1)
SODIUM: 140 meq/L (ref 136–145)
Total Bilirubin: 0.47 mg/dL (ref 0.20–1.20)
Total Protein: 6.5 g/dL (ref 6.4–8.3)

## 2014-05-14 ENCOUNTER — Encounter (INDEPENDENT_AMBULATORY_CARE_PROVIDER_SITE_OTHER): Payer: Self-pay | Admitting: General Surgery

## 2014-05-14 ENCOUNTER — Ambulatory Visit (INDEPENDENT_AMBULATORY_CARE_PROVIDER_SITE_OTHER): Payer: BC Managed Care – PPO | Admitting: General Surgery

## 2014-05-14 VITALS — BP 122/80 | HR 80 | Temp 97.6°F | Ht 64.0 in | Wt 163.0 lb

## 2014-05-14 DIAGNOSIS — C50212 Malignant neoplasm of upper-inner quadrant of left female breast: Secondary | ICD-10-CM

## 2014-05-14 DIAGNOSIS — C50219 Malignant neoplasm of upper-inner quadrant of unspecified female breast: Secondary | ICD-10-CM

## 2014-05-14 NOTE — Patient Instructions (Signed)
Continue tamoxifen Continue regular self exams

## 2014-05-14 NOTE — Progress Notes (Signed)
Subjective:     Patient ID: Olivia Gross, female   DOB: [birth year], 53 y.o.   MRN: [id number]  HPI The patient is a 53 year old white female who is 6 months status post left lumpectomy and negative sentinel node biopsy for a T1 C. N0 left breast cancer. She was ER and PR positive and HER-2/neu negative. She finished radiation therapy and is now taking tamoxifen and tolerating that well. She has no complaints today. She also states that she will be moving to [place] permanently in September.  Review of Systems  Constitutional: Negative.   HENT: Negative.   Eyes: Negative.   Respiratory: Negative.   Cardiovascular: Negative.   Gastrointestinal: Negative.   Endocrine: Negative.   Genitourinary: Negative.   Musculoskeletal: Negative.   Skin: Negative.   Allergic/Immunologic: Negative.   Neurological: Negative.   Hematological: Negative.   Psychiatric/Behavioral: Negative.        Objective:   Physical Exam  Constitutional: She is oriented to person, place, and time. She appears well-developed and well-nourished.  HENT:  Head: Normocephalic and atraumatic.  Eyes: Conjunctivae and EOM are normal. Pupils are equal, round, and reactive to light.  Neck: Normal range of motion. Neck supple.  Cardiovascular: Normal rate, regular rhythm and normal heart sounds.   Pulmonary/Chest: Effort normal and breath sounds normal.  Her left breast incision has healed nicely. There is no palpable mass in either breast. There is no palpable axillary, supraclavicular, or cervical lymphadenopathy.  Abdominal: Soft. Bowel sounds are normal.  Musculoskeletal: Normal range of motion.  Lymphadenopathy:    She has no cervical adenopathy.  Neurological: She is alert and oriented to person, place, and time.  Skin: Skin is warm and dry.  Psychiatric: She has a normal mood and affect. Her behavior is normal.       Assessment:     The patient is 6 months status post left breast lumpectomy for breast cancer      Plan:     At this point she will continue to take tamoxifen. She will continue to do regular self exams. I will have her go to medical records at the hospital to get all of her medical records to take to [place]. We will plan to see her back on a when necessary basis

## 2014-05-17 ENCOUNTER — Encounter: Payer: Self-pay | Admitting: Adult Health

## 2014-05-17 ENCOUNTER — Ambulatory Visit (HOSPITAL_BASED_OUTPATIENT_CLINIC_OR_DEPARTMENT_OTHER): Payer: BC Managed Care – PPO | Admitting: Adult Health

## 2014-05-17 VITALS — BP 162/90 | HR 78 | Temp 97.9°F | Resp 18 | Ht 64.0 in | Wt 166.2 lb

## 2014-05-17 DIAGNOSIS — C50212 Malignant neoplasm of upper-inner quadrant of left female breast: Secondary | ICD-10-CM

## 2014-05-17 DIAGNOSIS — C50219 Malignant neoplasm of upper-inner quadrant of unspecified female breast: Secondary | ICD-10-CM

## 2014-05-17 DIAGNOSIS — Z170 Estrogen receptor positive status [ER+]: Secondary | ICD-10-CM

## 2014-05-17 MED ORDER — TAMOXIFEN CITRATE 20 MG PO TABS: 20.0000 mg | ORAL_TABLET | Freq: Every day | ORAL | Status: AC

## 2014-05-17 NOTE — Patient Instructions (Signed)
You are doing well.  You have no sign of recurrence. Continue with healthy diet, exercise, monthly breast exams.  Quit smoking if you smoke.  Continue taking Tamoxifen daily.    Breast Self-Awareness Practicing breast self-awareness may pick up problems early, prevent significant medical complications, and possibly save your life. By practicing breast self-awareness, you can become familiar with how your breasts look and feel and if your breasts are changing. This allows you to notice changes early. It can also offer you some reassurance that your breast health is good. One way to learn what is normal for your breasts and whether your breasts are changing is to do a breast self-exam. If you find a lump or something that was not present in the past, it is best to contact your caregiver right away. Other findings that should be evaluated by your caregiver include nipple discharge, especially if it is bloody; skin changes or reddening; areas where the skin seems to be pulled in (retracted); or new lumps and bumps. Breast pain is seldom associated with cancer (malignancy), but should also be evaluated by a caregiver. HOW TO PERFORM A BREAST SELF-EXAM The best time to examine your breasts is 5-7 days after your menstrual period is over. During menstruation, the breasts are lumpier, and it may be more difficult to pick up changes. If you do not menstruate, have reached menopause, or had your uterus removed (hysterectomy), you should examine your breasts at regular intervals, such as monthly. If you are breastfeeding, examine your breasts after a feeding or after using a breast pump. Breast implants do not decrease the risk for lumps or tumors, so continue to perform breast self-exams as recommended. Talk to your caregiver about how to determine the difference between the implant and breast tissue. Also, talk about the amount of pressure you should use during the exam. Over time, you will become more familiar with  the variations of your breasts and more comfortable with the exam. A breast self-exam requires you to remove all your clothes above the waist. 1. Look at your breasts and nipples. Stand in front of a mirror in a room with good lighting. With your hands on your hips, push your hands firmly downward. Look for a difference in shape, contour, and size from one breast to the other (asymmetry). Asymmetry includes puckers, dips, or bumps. Also, look for skin changes, such as reddened or scaly areas on the breasts. Look for nipple changes, such as discharge, dimpling, repositioning, or redness. 2. Carefully feel your breasts. This is best done either in the shower or tub while using soapy water or when flat on your back. Place the arm (on the side of the breast you are examining) above your head. Use the pads (not the fingertips) of your three middle fingers on your opposite hand to feel your breasts. Start in the underarm area and use  inch (2 cm) overlapping circles to feel your breast. Use 3 different levels of pressure (light, medium, and firm pressure) at each circle before moving to the next circle. The light pressure is needed to feel the tissue closest to the skin. The medium pressure will help to feel breast tissue a little deeper, while the firm pressure is needed to feel the tissue close to the ribs. Continue the overlapping circles, moving downward over the breast until you feel your ribs below your breast. Then, move one finger-width towards the center of the body. Continue to use the  inch (2 cm) overlapping circles to  feel your breast as you move slowly up toward the collar bone (clavicle) near the base of the neck. Continue the up and down exam using all 3 pressures until you reach the middle of the chest. Do this with each breast, carefully feeling for lumps or changes. 3.  Keep a written record with breast changes or normal findings for each breast. By writing this information down, you do not need  to depend only on memory for size, tenderness, or location. Write down where you are in your menstrual cycle, if you are still menstruating. Breast tissue can have some lumps or thick tissue. However, see your caregiver if you find anything that concerns you.  SEEK MEDICAL CARE IF:  You see a change in shape, contour, or size of your breasts or nipples.   You see skin changes, such as reddened or scaly areas on the breasts or nipples.   You have an unusual discharge from your nipples.   You feel a new lump or unusually thick areas.  Document Released: 09/14/2005 Document Revised: 08/31/2012 Document Reviewed: 12/30/2011 [facility] Patient Information 2015 [place], [place]. This information is not intended to replace advice given to you by your health care provider. Make sure you discuss any questions you have with your health care provider.  Tamoxifen oral tablet What is this medicine? TAMOXIFEN (ta MOX i fen) blocks the effects of estrogen. It is commonly used to treat breast cancer. It is also used to decrease the chance of breast cancer coming back in women who have received treatment for the disease. It may also help prevent breast cancer in women who have a high risk of developing breast cancer. This medicine may be used for other purposes; ask your health care provider or pharmacist if you have questions. COMMON BRAND NAME(S): Nolvadex What should I tell my health care provider before I take this medicine? They need to know if you have any of these conditions: -blood clots -blood disease -cataracts or impaired eyesight -endometriosis -high calcium levels -high cholesterol -irregular menstrual cycles -liver disease -stroke -uterine fibroids -an unusual or allergic reaction to tamoxifen, other medicines, foods, dyes, or preservatives -pregnant or trying to get pregnant -breast-feeding How should I use this medicine? Take this medicine by mouth with a glass of water. Follow  the directions on the prescription label. You can take it with or without food. Take your medicine at regular intervals. Do not take your medicine more often than directed. Do not stop taking except on your doctor's advice. A special MedGuide will be given to you by the pharmacist with each prescription and refill. Be sure to read this information carefully each time. Talk to your pediatrician regarding the use of this medicine in children. While this drug may be prescribed for selected conditions, precautions do apply. Overdosage: If you think you have taken too much of this medicine contact a poison control center or emergency room at once. NOTE: This medicine is only for you. Do not share this medicine with others. What if I miss a dose? If you miss a dose, take it as soon as you can. If it is almost time for your next dose, take only that dose. Do not take double or extra doses. What may interact with this medicine? -aminoglutethimide -bromocriptine -chemotherapy drugs -female hormones, like estrogens and birth control pills -letrozole -medroxyprogesterone -phenobarbital -rifampin -warfarin This list may not describe all possible interactions. Give your health care provider a list of all the medicines, herbs, non-prescription drugs, or  dietary supplements you use. Also tell them if you smoke, drink alcohol, or use illegal drugs. Some items may interact with your medicine. What should I watch for while using this medicine? Visit your doctor or health care professional for regular checks on your progress. You will need regular pelvic exams, breast exams, and mammograms. If you are taking this medicine to reduce your risk of getting breast cancer, you should know that this medicine does not prevent all types of breast cancer. If breast cancer or other problems occur, there is no guarantee that it will be found at an early stage. Do not become pregnant while taking this medicine or for 2 months  after stopping this medicine. Stop taking this medicine if you get pregnant or think you are pregnant and contact your doctor. This medicine may harm your unborn baby. Women who can possibly become pregnant should use birth control methods that do not use hormones during tamoxifen treatment and for 2 months after therapy has stopped. Talk with your health care provider for birth control advice. Do not breast feed while taking this medicine. What side effects may I notice from receiving this medicine? Side effects that you should report to your doctor or health care professional as soon as possible: -changes in vision (blurred vision) -changes in your menstrual cycle -difficulty breathing or shortness of breath -difficulty walking or talking -new breast lumps -numbness -pelvic pain or pressure -redness, blistering, peeling or loosening of the skin, including inside the mouth -skin rash or itching (hives) -sudden chest pain -swelling of lips, face, or tongue -swelling, pain or tenderness in your calf or leg -unusual bruising or bleeding -vaginal discharge that is bloody, brown, or rust -weakness -yellowing of the whites of the eyes or skin Side effects that usually do not require medical attention (report to your doctor or health care professional if they continue or are bothersome): -fatigue -hair loss, although uncommon and is usually mild -headache -hot flashes -impotence (in men) -nausea, vomiting (mild) -vaginal discharge (white or clear) This list may not describe all possible side effects. Call your doctor for medical advice about side effects. You may report side effects to FDA at 1-800-FDA-1088. Where should I keep my medicine? Keep out of the reach of children. Store at room temperature between 20 and 25 degrees C (68 and 77 degrees F). Protect from light. Keep container tightly closed. Throw away any unused medicine after the expiration date. NOTE: This sheet is a summary. It  may not cover all possible information. If you have questions about this medicine, talk to your doctor, pharmacist, or health care provider.  2015, [employer]. (2008-05-31 12:01:56)

## 2014-05-17 NOTE — Progress Notes (Signed)
ID: Olivia Gross OB: June 02, 1961  MR#: [id number]  QAE#:[id number]  PCP: [name], MD GYN:   SU: Dr. Autumn Messing OTHER MD:  Dr. Janey Greaser oncology  CHIEF COMPLAINT: 53 y/o woman with left breast cancer here for evaluation following adjuvant radiation therapy.    BREAST CANCER HISTORY: 1.  Patient underwent screening mammogram performed on 09/06/2013 that revealed a possible mass in the left breast. In 10/12/2013 she had spot compression views performed that was consistent with a true mass lying medially near the 9:00 position measuring 7-8 mm in size. Ultrasound showed a lobulated hypoechoic mass in the 10:00 position 10 cm from the nipple measuring 8 mm x 6 mm x 6.6 mm. 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 Patient had MRI of the breasts performed on 10/30/2013 in the right breast there were no masses or abnormal enhancement. In the left breast within the upper-outer quadrant there was an enhancing mass associated with clip artifact measuring 1.3 x 1.1 x 1.9 cm. No abnormal appearing lymph nodes were identified.   INTERVAL HISTORY:  Patient is here for evaluation of her h/o left breast cancer.   Olivia Gross started Tamoxifen therapy about 3 months ago.  She is tolerating this therapy well.  She was recently diagnosed with vertigo at urgent care and was prescribed Meclizine.  She had dizziness and nausea.  She does have some occasional tolerable hot flashes, but is otherwise doing well and denies joint aches, vision changes, vaginal bleeding, swelling, new pain, headaches, bowel/bladder changes, chest pain, cough, shortness of breath or any further concerns.   REVIEW OF SYSTEMS: A 10 point review of systems was conducted and is otherwise negative except for what is noted above.     PAST MEDICAL HISTORY: Past Medical  History  Diagnosis Date  . Chicken pox   . Cervical cancer   . Wears dentures     full top-partial bottom  . Breast cancer 10/25/13    left, 9:00 o'clock  . Hx of radiation therapy 01/09/14 -02/12/14    left breast 5000 cGy in 25 sessions    PAST SURGICAL HISTORY: Past Surgical History  Procedure Laterality Date  . Cervical cone biopsy  1986  . Colonoscopy    . Partial mastectomy with needle localization and axillary sentinel lymph node bx Left 11/22/2013    Procedure: PARTIAL MASTECTOMY WITH NEEDLE LOCALIZATION AND AXILLARY SENTINEL LYMPH NODE BIOPSY;  Surgeon: Merrie Roof, MD;  Location: [facility];  Service: General;  Laterality: Left;  . Foreign body removal Left 11/22/2013    Procedure: removal of implanon from left upper arm ;  Surgeon: Merrie Roof, MD;  Location: [facility];  Service: General;  Laterality: Left;    FAMILY HISTORY Family History  Problem Relation Age of Onset  . Arthritis Mother   . COPD Mother   . Cancer Mother     cervical  . Mental illness Maternal Aunt   . Heart disease Paternal Uncle   . Diabetes Maternal Grandmother   . Diabetes Maternal Grandfather   . Diabetes Sister   . Hypertension Sister     GYNECOLOGIC HISTORY: menarche at age 53, g63 p2, birth control use for 27 years, did have Norplant too which was removed at the time of her surgery.  Had no complications with this.  30 years  ago patient had abnormal pap smear that required colposcopy with conization.  Everything was normal and she has not had another abnormal pap smear since.  No h/o sexually transmitted infections.  LMP 8 years ago.    SOCIAL HISTORY: Lives with husband of 1 year Richard in an apartment on the first floor, in [place], [place].  Patient has 2 children ages 52 and 29.  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  She is moving to [place], [place]: Not in place   HEALTH MAINTENANCE: History  Substance Use Topics   . Smoking status: Current Every Day Smoker -- 1.00 packs/day for 20 years    Types: Cigarettes  . Smokeless tobacco: Not on file  . Alcohol Use: Yes     Comment: occaisonally      Mammogram:  09/06/2013 Colonoscopy: 09/2013, 10 year f/u recommended Bone Density Scan: n/a Pap Smear: 2013, 3 year f/u recommended Eye Exam:  2012  Vitamin D Level:  08/04/2013 Lipid Panel: 08/04/2013   No Known Allergies  Current Outpatient Prescriptions  Medication Sig Dispense Refill  . MECLIZINE HCL PO Take by mouth. Take 1 tablet every 4-6 hours as needed for dizziness. Patient does not know dose.      . Multiple Vitamin (MULTIVITAMIN) tablet Take 1 tablet by mouth daily.      . naproxen sodium (ANAPROX) 220 MG tablet Take 220 mg by mouth daily as needed. Take for sinuses      . tamoxifen (NOLVADEX) 20 MG tablet Take 1 tablet (20 mg total) by mouth daily.  90 tablet  3  . psyllium (METAMUCIL) 58.6 % powder Take 1 packet by mouth daily.       No current facility-administered medications for this visit.    OBJECTIVE: Filed Vitals:   05/17/14 1400  BP: 162/90  Pulse: 78  Temp: 97.9 F (36.6 C)  Resp: 18     Body mass index is 28.51 kg/(m^2).     GENERAL: Patient is a well appearing female in no acute distress HEENT:  Sclerae anicteric.  Oropharynx clear and moist. No ulcerations or evidence of oropharyngeal candidiasis. Neck is supple.  NODES:  No cervical, supraclavicular, or axillary lymphadenopathy palpated.  BREAST EXAM:  Right breast with no masses or nodules, left breast with erythema, lumpectomy site well healed, no nodularity LUNGS:  Clear to auscultation bilaterally.  No wheezes or rhonchi. HEART:  Regular rate and rhythm. No murmur appreciated. ABDOMEN:  Soft, nontender.  Positive, normoactive bowel sounds. No organomegaly palpated. MSK:  No focal spinal tenderness to palpation. Full range of motion bilaterally in the upper extremities. EXTREMITIES:  No peripheral edema.   SKIN:   Clear with no obvious rashes or skin changes. No nail dyscrasia. NEURO:  Nonfocal. Well oriented.  Appropriate affect. ECOG FS:1 - Symptomatic but completely ambulatory  LAB RESULTS:  CMP     Component Value Date/Time   NA 140 05/10/2014 1411   NA 138 08/04/2013 1628   K 4.0 05/10/2014 1411   K 3.7 08/04/2013 1628   CL 104 08/04/2013 1628   CO2 26 05/10/2014 1411   CO2 28 08/04/2013 1628   GLUCOSE 88 05/10/2014 1411   GLUCOSE 76 08/04/2013 1628   BUN 10.5 05/10/2014 1411   BUN 11 08/04/2013 1628   CREATININE 0.7 05/10/2014 1411   CREATININE 0.71 08/04/2013 1628   CREATININE 0.6 06/27/2012 1106   CALCIUM 9.1 05/10/2014 1411   CALCIUM 9.3 08/04/2013 1628  PROT 6.5 05/10/2014 1411   PROT 6.5 08/04/2013 1628   ALBUMIN 3.8 05/10/2014 1411   ALBUMIN 4.4 08/04/2013 1628   AST 15 05/10/2014 1411   AST 14 08/04/2013 1628   ALT 10 05/10/2014 1411   ALT 11 08/04/2013 1628   ALKPHOS 44 05/10/2014 1411   ALKPHOS 43 08/04/2013 1628   BILITOT 0.47 05/10/2014 1411   BILITOT 0.7 08/04/2013 1628    I No results found for this basename: SPEP,  UPEP,   kappa and lambda light chains    Lab Results  Component Value Date   WBC 6.3 05/10/2014   NEUTROABS 3.9 05/10/2014   HGB 12.7 05/10/2014   HCT 38.6 05/10/2014   MCV 93.5 05/10/2014   PLT 222 05/10/2014      Chemistry      Component Value Date/Time   NA 140 05/10/2014 1411   NA 138 08/04/2013 1628   K 4.0 05/10/2014 1411   K 3.7 08/04/2013 1628   CL 104 08/04/2013 1628   CO2 26 05/10/2014 1411   CO2 28 08/04/2013 1628   BUN 10.5 05/10/2014 1411   BUN 11 08/04/2013 1628   CREATININE 0.7 05/10/2014 1411   CREATININE 0.71 08/04/2013 1628   CREATININE 0.6 06/27/2012 1106      Component Value Date/Time   CALCIUM 9.1 05/10/2014 1411   CALCIUM 9.3 08/04/2013 1628   ALKPHOS 44 05/10/2014 1411   ALKPHOS 43 08/04/2013 1628   AST 15 05/10/2014 1411   AST 14 08/04/2013 1628   ALT 10 05/10/2014 1411   ALT 11 08/04/2013 1628   BILITOT 0.47 05/10/2014 1411   BILITOT 0.7  08/04/2013 1628       No results found for this basename: LABCA2    No components found with this basename: LABCA125    No results found for this basename: INR,  in the last 168 hours  Urinalysis No results found for this basename: colorurine,  appearanceur,  labspec,  phurine,  glucoseu,  hgbur,  bilirubinur,  ketonesur,  proteinur,  urobilinogen,  nitrite,  leukocytesur    STUDIES: No results found.  ASSESSMENT: 53 y.o. [place], [place] woman with T1, N0, stage IA, invasive ductal carcinoma, grade II, ER 89%, PR 89%, Ki-67 25%, HER-2/neu negative.   1.  status post left lumpectomy with sentinel lymph node biopsy performed 11/22/2013 by Dr. Marlou Starks. The final pathology revealed a 1.6 cm grade II, invasive ductal carcinoma.  Margins were negative, and there was no lymphovascular invasion.    2. Oncotype DX testing performed on the tumor revealed: The patient's recurrence score was 17. Those patients who had a recurrence score of 17 had an average rate of distant  recurrence of 11%.   This is considered low risk for distant recurrence.    3. The patient underwent adjuvant radiation therapy from 01/09/14 through 02/12/14.  4. Patient began anti-estrogen therapy in May, 2015 with Tamoxifen daily for two years and then we will re-evaluate the patient and consider starting an aromatase inhibitor.  PLAN:   Olivia Gross is doing well today.  She is taking Tamoxifen daily and tolerating it well.  She has no sign of recurrence.  She will continue this. She is planning on moving to [place] in a couple of weeks and we discussed that.  I prescribed a 90 day supply of Tamoxifen.  I also requested Arsenio Loader complete and file the necessary paperwork for the patient to pick up her medical records so she can establish care with a  local oncologist once she moves.  I recommended healthy diet, exercise, and monthly breast exams.  I gave her detailed information in her AVS.     The patient will return to [place] if  she ever needs anything in the future, we are happy to see her back.  She knows to call us in the interim for any questions or concerns.  We can certainly see her sooner if needed.  I spent 25 minutes counseling the patient face to face.  The total time spent in the appointment was 30 minutes.  Minette Headland, Onset [phone] 05/18/2014 1:39 PM

## 2014-05-21 ENCOUNTER — Telehealth: Payer: Self-pay | Admitting: Family Medicine

## 2014-05-21 NOTE — Telephone Encounter (Signed)
FYI, pt on your schedule for tomorrow.

## 2014-05-21 NOTE — Telephone Encounter (Signed)
Patient Information:  Caller Name: Chanise  Phone: [phone]  Patient: Olivia Gross, Olivia Gross  Gender: Female  DOB: 1961/04/15  Age: 53 Years  PCP: Midge Minium  Pregnant: No  Office Follow Up:  Does the office need to follow up with this patient?: No  Instructions For The Office: N/A   Symptoms  Reason For Call & Symptoms: Pt repirts she is having sinus issues, earache, sore throat, dizziness, nausea.  Reviewed Health History In EMR: Yes  Reviewed Medications In EMR: Yes  Reviewed Allergies In EMR: Yes  Reviewed Surgeries / Procedures: Yes  Date of Onset of Symptoms: 04/30/2014 OB / GYN:  LMP: Unknown  Guideline(s) Used:  Sinus Pain and Congestion  Disposition Per Guideline:   See Today or Tomorrow in Office  Reason For Disposition Reached:   Sinus congestion (pressure, fullness) present > 10 days  Advice Given:  Call Back If:   You become worse.  Patient Will Follow Care Advice:  YES  Appointment Scheduled:  05/22/2014 13:15:00 Appointment Scheduled Provider:  Kathlene November

## 2014-05-21 NOTE — Telephone Encounter (Signed)
thx

## 2014-05-22 ENCOUNTER — Encounter: Payer: Self-pay | Admitting: Internal Medicine

## 2014-05-22 ENCOUNTER — Ambulatory Visit (INDEPENDENT_AMBULATORY_CARE_PROVIDER_SITE_OTHER): Payer: BC Managed Care – PPO | Admitting: Internal Medicine

## 2014-05-22 VITALS — BP 138/68 | HR 78 | Temp 98.4°F | Wt 166.0 lb

## 2014-05-22 DIAGNOSIS — R42 Dizziness and giddiness: Secondary | ICD-10-CM

## 2014-05-22 DIAGNOSIS — J069 Acute upper respiratory infection, unspecified: Secondary | ICD-10-CM

## 2014-05-22 MED ORDER — AMOXICILLIN 500 MG PO CAPS: 1000.0000 mg | ORAL_CAPSULE | Freq: Two times a day (BID) | ORAL | Status: AC

## 2014-05-22 NOTE — Progress Notes (Signed)
Pre-visit discussion using our clinic review tool. No additional management support is needed unless otherwise documented below in the visit note.  

## 2014-05-22 NOTE — Patient Instructions (Signed)
For dizziness, rest, take less of fluids, call if you're not gradually better in the next 2 or 3 weeks. Call if thedizziness gets a lot worse.  For cough, take Mucinex DM twice a day as needed and   OTC Nasocort: 2 nasal sprays on each side of the nose daily until you feel better   Take the antibiotic as prescribed  (Amoxicillin) if no better in 3-4 days   Call anytime if the symptoms are severe

## 2014-05-22 NOTE — Progress Notes (Signed)
Subjective:    Patient ID: Olivia Gross, female    DOB: [birth year], 53 y.o.   MRN: [id number]  DOS:  05/22/2014 Type of visit - description: acute History: 3 weeks history of dizziness, described as a spinning feeling, usually when she bends over or lies down in bed, last few seconds and is often times associated with nausea. Went to urgent care 05/06/2014, was prescribed meclizine which has not helped.  Additionally, has been noting some left ear discomfort for 3 weeks and thinks she got a URI  2 days ago, increase cough and occasional sputum  ROS Subjective fever? No chills. Mild wheezing sometimes, smoker  Denies any diplopia, no slurred speech, motor deficits; no  head injury  Past Medical History  Diagnosis Date  . Chicken pox   . Cervical cancer   . Wears dentures     full top-partial bottom  . Breast cancer 10/25/13    left, 9:00 o'clock  . Hx of radiation therapy 01/09/14 -02/12/14    left breast 5000 cGy in 25 sessions    Past Surgical History  Procedure Laterality Date  . Cervical cone biopsy  1986  . Colonoscopy    . Partial mastectomy with needle localization and axillary sentinel lymph node bx Left 11/22/2013    Procedure: PARTIAL MASTECTOMY WITH NEEDLE LOCALIZATION AND AXILLARY SENTINEL LYMPH NODE BIOPSY;  Surgeon: Merrie Roof, MD;  Location: [facility];  Service: General;  Laterality: Left;  . Foreign body removal Left 11/22/2013    Procedure: removal of implanon from left upper arm ;  Surgeon: Merrie Roof, MD;  Location: [facility];  Service: General;  Laterality: Left;    History   Social History  . Marital Status: Married    Spouse Name: N/A    Number of Children: N/A  . Years of Education: N/A   Occupational History  . Not on file.   Social History Main Topics  . Smoking status: Current Every Day Smoker -- 1.00 packs/day for 20 years    Types: Cigarettes  . Smokeless tobacco: Not on file  . Alcohol Use: Yes   Comment: occaisonally  . Drug Use: No  . Sexual Activity: Yes     Comment: menarche age 61, first live birth age 47, P26, menopause 2012   Other Topics Concern  . Not on file   Social History Narrative  . No narrative on file        Medication List       This list is accurate as of: 05/22/14 11:59 PM.  Always use your most recent med list.               amoxicillin 500 MG capsule  Commonly known as:  AMOXIL  Take 2 capsules (1,000 mg total) by mouth 2 (two) times daily.     MECLIZINE HCL PO  Take by mouth. Take 1 tablet every 4-6 hours as needed for dizziness. Patient does not know dose.     multivitamin tablet  Take 1 tablet by mouth daily.     naproxen sodium 220 MG tablet  Commonly known as:  ANAPROX  Take 220 mg by mouth daily as needed. Take for sinuses     tamoxifen 20 MG tablet  Commonly known as:  NOLVADEX  Take 1 tablet (20 mg total) by mouth daily.           Objective:   Physical Exam BP 138/68  Pulse 78  Temp(Src)  98.4 F (36.9 C) (Oral)  Wt 166 lb (75.297 kg)  SpO2 99% General -- alert, well-developed, NAD.  Neck --  normal carotid pulse  HEENT-- Not pale. TMs normal Lungs -- normal respiratory effort, no intercostal retractions, no accessory muscle use, and normal breath sounds.  Heart-- normal rate, regular rhythm, no murmur.   Extremities-- no pretibial edema bilaterally  Neurologic--  alert & oriented X3. Speech normal, gait appropriate for age, strength symmetric and appropriate for age.  DTRs symmetric. EOMI, PERLA   [place] absent Psych-- Cognition and judgment appear intact. Cooperative with normal attention span and concentration. No anxious or depressed appearing.        Assessment & Plan:   Dizziness,   Likely peripheral description, recommend observation for now, if not improving soon or if she gets worse she needs to let me know  URI, Increase cough and sputum production from baseline for the last 2 days. She also has  left ear discomfort for 3 weeks but tympanic membranes are normal. Plan: see instructions

## 2014-08-08 ENCOUNTER — Encounter: Payer: BC Managed Care – PPO | Admitting: Family Medicine

## 2014-09-12 IMAGING — US US BREAST LTD UNI LEFT INC AXILLA
1 series · 4 of 4 positions shown · non-contrast
Comparison: 07/07/2012

CLINICAL DATA: Possible mass in the left breast on the current
screening study.

EXAM:
DIGITAL DIAGNOSTIC  LEFT MAMMOGRAM
ULTRASOUND LEFT BREAST

[Series 1: us breast ltd uni left inc axilla · 4 of 4 slices shown]
[im 1/4]
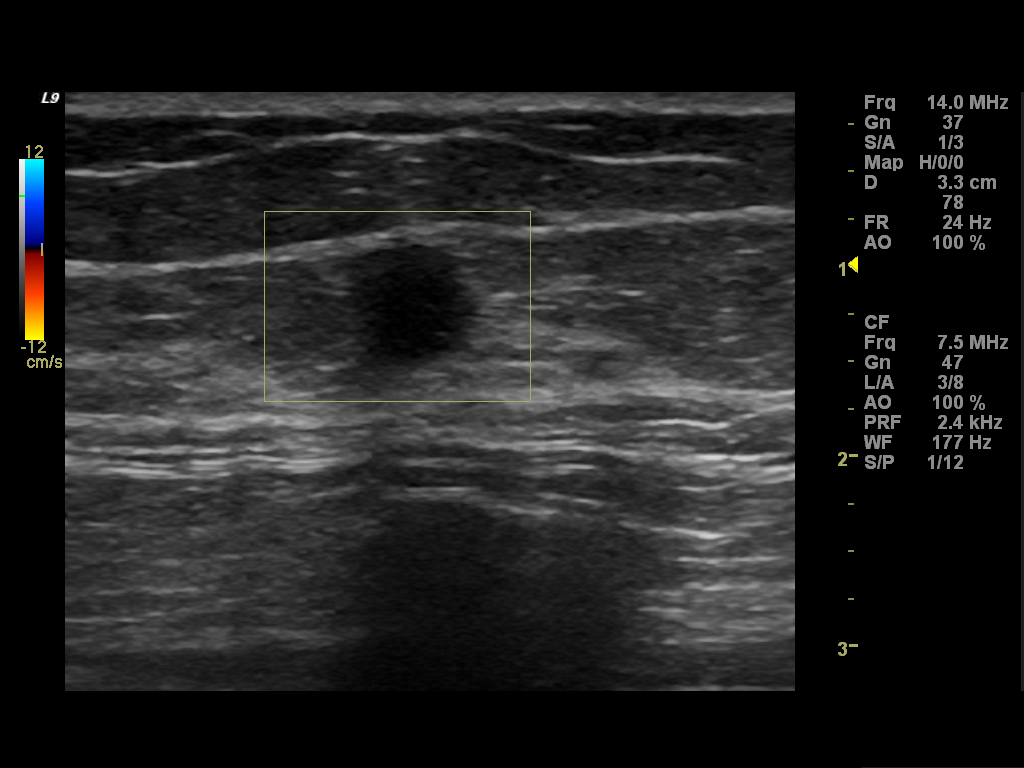
[im 2/4]
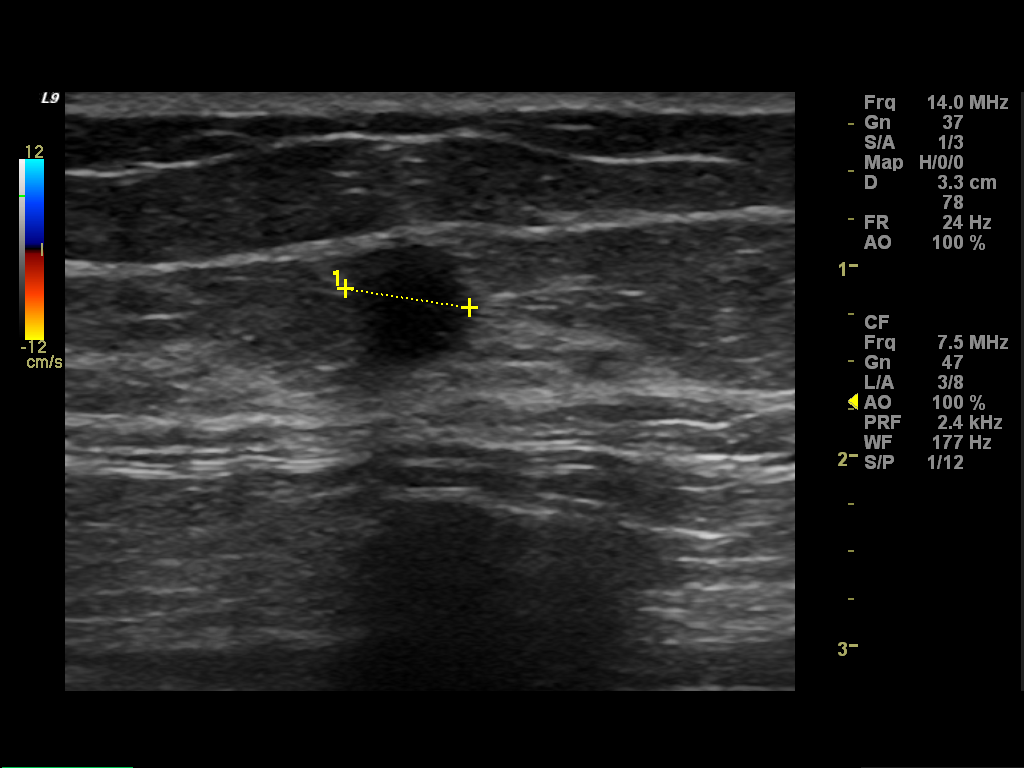
[im 3/4]
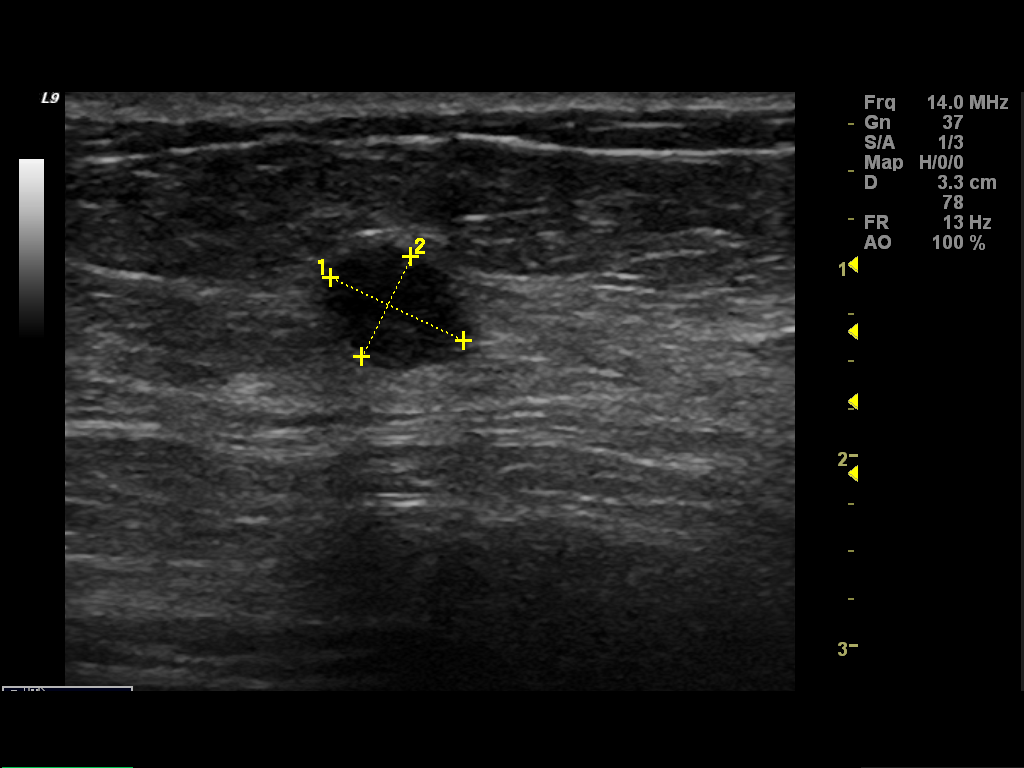
[im 4/4]
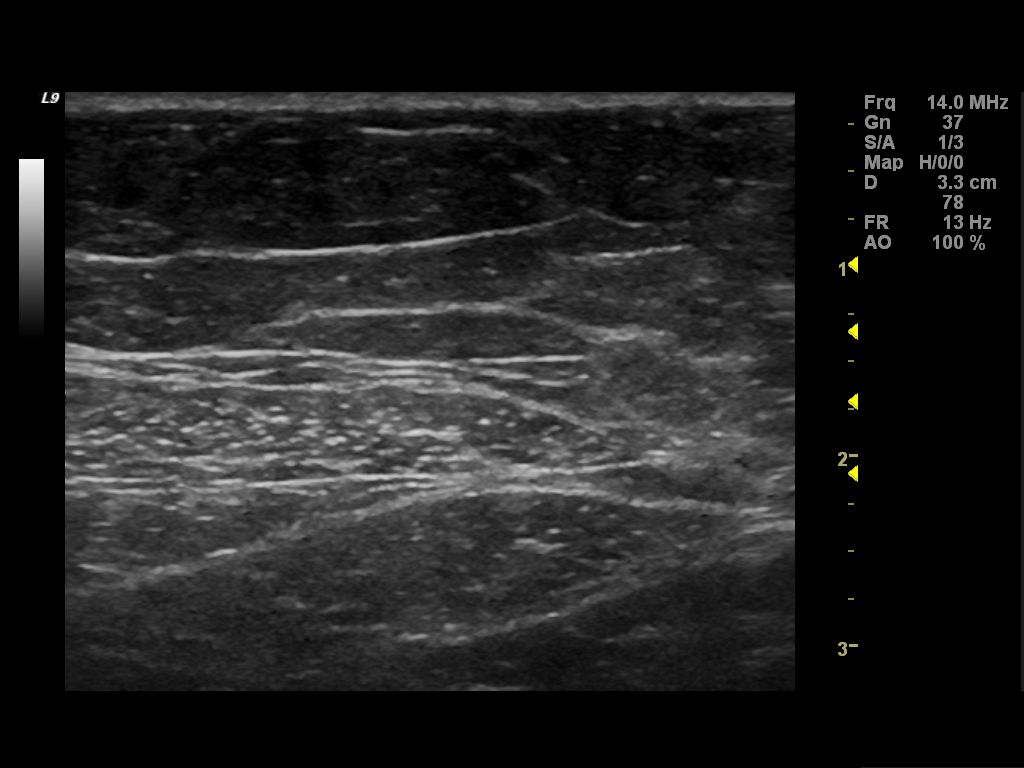

[4 of 4 positions shown; findings below may reference images not displayed]

ACR Breast Density Category c: The breast tissue is heterogeneously
dense, which may obscure small masses.
FINDINGS: Possible mass persists on spot compression imaging consistent with a
true mass. That lies medially, nearly 9 o'clock position. It
measures approximately 7-8 mm in size.

Ultrasound is performed, showing a lobulated, hypoechoic mass in the
10 o'clock position, 10 cm the nipple measuring 8 mm x 6 mm x
mm. There is minimal internal blood flow.

Sonographic imaging of the left axilla shows no masses or enlarged
or abnormal lymph nodes.
IMPRESSION: Suspicious left breast mass for breast carcinoma.

RECOMMENDATION:
Biopsy. Patient will be scheduled for ultrasound-guided core needle
biopsy of this left breast mass.

I have discussed the findings and recommendations with the patient.
Results were also provided in writing at the conclusion of the
visit. If applicable, a reminder letter will be sent to the patient
regarding the next appointment.

BI-RADS CATEGORY  5: Highly suggestive of malignancy - appropriate
action should be taken.

## 2015-06-10 ENCOUNTER — Telehealth: Payer: Self-pay | Admitting: Nurse Practitioner

## 2015-06-10 NOTE — Telephone Encounter (Signed)
Notified pharmacy per Dr. Marko Plume that we are declining request for tamoxifen refill as patient is not under Dr. Mariana Kaufman care and does not have scheduled f/u with this office. Pharmacy to contact patient.
